# Patient Record
Sex: Female | Born: 1975 | Race: White | Hispanic: No | Marital: Married | Smoking: Never smoker
Health system: Southern US, Community
[De-identification: ages and names within clinical notes are randomized; demographics above are authoritative.]

---

## 1998-03-07 ENCOUNTER — Emergency Department (HOSPITAL_COMMUNITY): Admission: EM | Admit: 1998-03-07 | Discharge: 1998-03-07 | Payer: Self-pay | Admitting: Emergency Medicine

## 1998-04-08 ENCOUNTER — Emergency Department (HOSPITAL_COMMUNITY): Admission: EM | Admit: 1998-04-08 | Discharge: 1998-04-08 | Payer: Self-pay | Admitting: Emergency Medicine

## 1998-11-26 ENCOUNTER — Encounter: Payer: Self-pay | Admitting: Emergency Medicine

## 1998-11-26 ENCOUNTER — Emergency Department (HOSPITAL_COMMUNITY): Admission: EM | Admit: 1998-11-26 | Discharge: 1998-11-26 | Payer: Self-pay | Admitting: Emergency Medicine

## 1999-02-26 ENCOUNTER — Emergency Department (HOSPITAL_COMMUNITY): Admission: EM | Admit: 1999-02-26 | Discharge: 1999-02-26 | Payer: Self-pay | Admitting: Emergency Medicine

## 2001-02-14 ENCOUNTER — Emergency Department (HOSPITAL_COMMUNITY): Admission: EM | Admit: 2001-02-14 | Discharge: 2001-02-14 | Payer: Self-pay | Admitting: Emergency Medicine

## 2001-04-17 ENCOUNTER — Other Ambulatory Visit: Admission: RE | Admit: 2001-04-17 | Discharge: 2001-04-17 | Payer: Self-pay | Admitting: Obstetrics and Gynecology

## 2002-06-12 ENCOUNTER — Other Ambulatory Visit: Admission: RE | Admit: 2002-06-12 | Discharge: 2002-06-12 | Payer: Self-pay | Admitting: Obstetrics and Gynecology

## 2002-08-02 ENCOUNTER — Ambulatory Visit (HOSPITAL_COMMUNITY): Admission: RE | Admit: 2002-08-02 | Discharge: 2002-08-02 | Payer: Self-pay | Admitting: Obstetrics and Gynecology

## 2002-08-02 ENCOUNTER — Encounter: Payer: Self-pay | Admitting: Obstetrics and Gynecology

## 2002-11-28 ENCOUNTER — Inpatient Hospital Stay (HOSPITAL_COMMUNITY): Admission: AD | Admit: 2002-11-28 | Discharge: 2002-11-28 | Payer: Self-pay | Admitting: Obstetrics and Gynecology

## 2002-11-30 ENCOUNTER — Inpatient Hospital Stay (HOSPITAL_COMMUNITY): Admission: AD | Admit: 2002-11-30 | Discharge: 2002-11-30 | Payer: Self-pay | Admitting: Obstetrics and Gynecology

## 2002-12-02 ENCOUNTER — Encounter: Admission: RE | Admit: 2002-12-02 | Discharge: 2002-12-02 | Payer: Self-pay | Admitting: Obstetrics and Gynecology

## 2002-12-05 ENCOUNTER — Encounter: Admission: RE | Admit: 2002-12-05 | Discharge: 2002-12-05 | Payer: Self-pay | Admitting: Obstetrics and Gynecology

## 2002-12-05 ENCOUNTER — Inpatient Hospital Stay (HOSPITAL_COMMUNITY): Admission: AD | Admit: 2002-12-05 | Discharge: 2002-12-05 | Payer: Self-pay | Admitting: Internal Medicine

## 2002-12-09 ENCOUNTER — Encounter: Admission: RE | Admit: 2002-12-09 | Discharge: 2002-12-09 | Payer: Self-pay | Admitting: Obstetrics and Gynecology

## 2002-12-10 ENCOUNTER — Inpatient Hospital Stay (HOSPITAL_COMMUNITY): Admission: AD | Admit: 2002-12-10 | Discharge: 2002-12-10 | Payer: Self-pay | Admitting: Obstetrics and Gynecology

## 2002-12-10 ENCOUNTER — Encounter: Admission: RE | Admit: 2002-12-10 | Discharge: 2002-12-10 | Payer: Self-pay | Admitting: Obstetrics and Gynecology

## 2002-12-12 ENCOUNTER — Encounter: Admission: RE | Admit: 2002-12-12 | Discharge: 2002-12-12 | Payer: Self-pay | Admitting: *Deleted

## 2002-12-13 ENCOUNTER — Inpatient Hospital Stay (HOSPITAL_COMMUNITY): Admission: AD | Admit: 2002-12-13 | Discharge: 2002-12-18 | Payer: Self-pay | Admitting: Obstetrics and Gynecology

## 2002-12-14 ENCOUNTER — Encounter (INDEPENDENT_AMBULATORY_CARE_PROVIDER_SITE_OTHER): Payer: Self-pay | Admitting: Specialist

## 2002-12-17 ENCOUNTER — Encounter: Payer: Self-pay | Admitting: Cardiology

## 2004-01-23 ENCOUNTER — Emergency Department (HOSPITAL_COMMUNITY): Admission: EM | Admit: 2004-01-23 | Discharge: 2004-01-23 | Payer: Self-pay | Admitting: Family Medicine

## 2004-01-27 ENCOUNTER — Emergency Department (HOSPITAL_COMMUNITY): Admission: EM | Admit: 2004-01-27 | Discharge: 2004-01-27 | Payer: Self-pay | Admitting: Family Medicine

## 2004-12-19 ENCOUNTER — Emergency Department (HOSPITAL_COMMUNITY): Admission: EM | Admit: 2004-12-19 | Discharge: 2004-12-19 | Payer: Self-pay | Admitting: Emergency Medicine

## 2007-02-23 ENCOUNTER — Emergency Department (HOSPITAL_COMMUNITY): Admission: EM | Admit: 2007-02-23 | Discharge: 2007-02-23 | Payer: Self-pay | Admitting: Family Medicine

## 2007-04-13 ENCOUNTER — Emergency Department (HOSPITAL_COMMUNITY): Admission: EM | Admit: 2007-04-13 | Discharge: 2007-04-13 | Payer: Self-pay | Admitting: Emergency Medicine

## 2008-03-11 ENCOUNTER — Emergency Department (HOSPITAL_COMMUNITY): Admission: EM | Admit: 2008-03-11 | Discharge: 2008-03-11 | Payer: Self-pay | Admitting: Emergency Medicine

## 2009-01-02 ENCOUNTER — Emergency Department (HOSPITAL_COMMUNITY): Admission: EM | Admit: 2009-01-02 | Discharge: 2009-01-02 | Payer: Self-pay | Admitting: Family Medicine

## 2009-05-05 ENCOUNTER — Emergency Department (HOSPITAL_COMMUNITY): Admission: EM | Admit: 2009-05-05 | Discharge: 2009-05-05 | Payer: Self-pay | Admitting: Family Medicine

## 2010-05-09 LAB — POCT RAPID STREP A (OFFICE): Streptococcus, Group A Screen (Direct): POSITIVE — AB

## 2010-07-02 NOTE — Op Note (Signed)
NAME:  Carol Ruiz, Carol Ruiz                         ACCOUNT NO.:  [id number]   MEDICAL RECORD NO.:  [id number]                   PATIENT TYPE:  INP   LOCATION:  9161                                 FACILITY:  [facility]   PHYSICIAN:  Malachi Pro. Ambrose Mantle, M.D.              DATE OF BIRTH:  1975-10-19   DATE OF PROCEDURE:  12/15/2002  DATE OF DISCHARGE:                                 OPERATIVE REPORT   PREOPERATIVE DIAGNOSES:  1. Intrauterine pregnancy at 37+ weeks.  2. Preeclampsia.  3. Failure to progress in labor.  4. Probable chorioamnionitis.   POSTOPERATIVE DIAGNOSES:  1. Intrauterine pregnancy at 37+ weeks.  2. Preeclampsia.  3. Failure to progress in labor.  4. Probable chorioamnionitis.   OPERATIONS:  1. Low transverse cervical cesarean section.  2. Vacuum-assisted delivery of the vertex.   OPERATOR:  Malachi Pro. Ambrose Mantle, M.D.   ANESTHESIA:  Spinal.   DESCRIPTION OF PROCEDURE:  The patient was brought to the operating room and  given a spinal anesthetic by Dr. Pamalee Leyden.  She was placed in the left lateral  tilt position.  The abdomen and urethra were prepped with Betadine solution.  A Foley catheter was inserted to straight drain.  The area was draped as a  sterile field.  After anesthesia was confirmed, a transfer incision was made  and carried in layers through the skin, subcutaneous tissue, and fascia.  The fascia was separated from the rectum muscle superiorly and inferiorly.  The rectus muscle was split in the midline and the peritoneum was opened  vertically.  The lower uterine segment was exposed and incision was made  into the lower uterine segment with the knife.  Then I used my finger to  enter the uterine cavity.  I bluntly opened the uterus by pulling inferiorly  and superiorly.  The cord and the vertex were presenting at the incisional  opening.  I tried to deliver the vertex with fundal pressure by the  assistant, but I could not get the vertex through the incisional  opening.  I  therefore used a kiwi cup to place it on the vertex, created the pressure  into the green zone, and then with fundal pressure I gently pulled the  vertex through the incisional opening.  The nose and pharynx were suctioned  with the bulb because there was very thick bloody mucus.  Then the remainder  of the baby was delivered.  It was also a challenge.  I had to lift both  shoulders out one after the other.  The cord was clamped.  The infant was  given to Dr. Tillman Abide who was in attendance.  It was a female infant, 6 pounds 15  ounces, and Dr. Tillman Abide assigned Apgars of 1 at one minute and 8 at five  minutes.  The cord pH was 7.20.  Routine cord blood studies were obtained  and then the placenta was removed from  the uterus.  The inside of the uterus  was inspected and found to be free of debris.  Both tubes and ovaries  appeared normal.  The uterine incision was sutured with some difficulty.  Because of the patient's size, bowel and omentum tried to enter the  operative field.  I used a running lock suture of 0 Vicryl on the first  layer and a nonlocking suture of the same material on the second layer.  Liberal irrigation confirmed hemostasis.  It confirmed that the gutters were  free of blood.  The rectus muscle was then reapproximated in the midline  using interrupted sutures of 0 Vicryl.  I did not reapproximate either the  parietal or the visceral peritoneum.  The rectus fascia was then closed with  two running sutures of 0 Vicryl, the subcutaneous with a running 3-0 Vicryl,  and the skin was closed with automatic stapler.  The patient tolerated the  procedure well.  The blood loss was estimated by the nurse anesthetist at  600 mL.  I estimated 1000 mL.  The sponge and needle counts were correct.  The patient was returned to recovery in satisfactory condition.  She was  continued on her magnesium sulfate and will be continued on clindamycin and  gentamicin.  It should be noted that  in spite of vigorous manual massage of  the uterus and rapid infusion of Pitocin that the patient's uterus remained  quite boggy after delivery of the baby, but did not seem to bleed  significantly.  Finally, I was satisfied with the rigidity of the uterus and  I quit massaging.                                               Malachi Pro. Ambrose Mantle, M.D.    TFH/MEDQ  D:  12/15/2002  T:  12/15/2002  Job:  [id number]

## 2010-07-02 NOTE — Consult Note (Signed)
NAMEJARAH, PEMBER                           ACCOUNT NO.:  [id number]   MEDICAL RECORD NO.:  [id number]                   PATIENT TYPE:   LOCATION:                                       FACILITY:   PHYSICIAN:  Veneda Melter, M.D.                   DATE OF BIRTH:  December 24, 1975   DATE OF CONSULTATION:  12/17/2002  DATE OF DISCHARGE:                                   CONSULTATION   BRIEF HISTORY:  We were asked to see this 35 year old female admitted to  [facility] on December 10, 2002 to undergo induction of labor secondary  to preeclampsia.  The patient does not have a primary care physician.  She  was admitted by Malachi Pro. Ambrose Mantle, M.D.  She was noted to have an elevated  white blood cell count on admission.  She received IV Unasyn during her stay  and developed dyspnea and a rash treated with Benadryl and epinephrine.  She  has been noted to have some low grade fevers with a temperature of 102 on  the 30th.  She underwent a cesarean section on the 31st.  She was noted to  be tachycardic with a rate in the 120s-130s during the cesarean section.   Yesterday the patient also had tachycardia, rate 110-120s.  She had  occasional brady junctional rhythm as well.  She denies any chest pain.  She  has had mild shortness of breath, although she does have a history of  asthma; however, she has not really had any problems with her asthma over  the past 10 years.   PAST MEDICAL HISTORY:  1. As noted, patient has a history of asthma, but no problems for at least     10 years.  2. History of migraine headaches.  3. Preeclampsia with this pregnancy.   ALLERGIES:  UNASYN.   CURRENT MEDICATIONS:  1. Senna.  2. Mylanta.   SOCIAL HISTORY:  The patient is married.  She lives in [place].  She does  not use alcohol or tobacco.   FAMILY HISTORY:  The patient's father died at age 25 from an MI.  He also  had hypertension and diabetes mellitus.  The patient's mother is living.  She has  thyroid problems and history of migraines.   REVIEW OF SYSTEMS:  Essentially negative except for as noted above and the  following.  She has recently had fever and chills.  She does have history of  migraine headaches.  She had a rash secondary to her reaction to the  antibiotic Unasyn.  She has had some shortness of breath.  She has had some  lower extremity edema.  She has had some gas and some constipation.   PHYSICAL EXAMINATION:  GENERAL:  Pleasant 35 year old white female in no  acute distress.  VITAL SIGNS:  Blood pressure 158/100, pulse 100s up to 120s, temperature  97.3.  HEENT:  Unremarkable.  NECK:  No bruits.  No jugular venous distention.  HEART:  Regular rapid rhythm without murmur.  LUNGS:  Clear.  ABDOMEN:  Obese, soft, nontender.  SKIN:  Warm and dry.  EXTREMITIES:  2+ pitting edema bilaterally.   LABORATORY DATA:  There has not been a chest x-ray.  An EKG on the first  shows normal sinus rhythm, rate 100 beats per minute with poor R-wave  progression and a slight intraventricular conduction delay.  Another EKG  showed sinus tachycardia, rate 126, also with poor R-wave progression and a  slight intraventricular conduction delay.  A CBC reveals hemoglobin 10,  hematocrit 29.6, WBC 18.9, platelets 336,000.  TSH was 3.958.  Magnesium  level elevated at 5.9.  Uric acid elevated at 8.1.  LDH 194.  Chemistry  profile revealed BUN 8, creatinine 1.0, potassium 4.5, total bilirubin 0.2.   IMPRESSION:  1. Sinus tachycardia.  2. Junctional bradycardia.  3. Anemia.  4. Elevated wbc's and fever, question infection.  5. Postoperative day #2 c-section.  6. Reaction to Unasyn, treated with epinephrine and Benadryl.  7. History of asthma with no recent problems.  8. History of migraine headaches.  9. Recent preeclampsia.  10.      Hypertension.  11.      Elevated magnesium levels.   PLAN:  We will obtain a 2-D echocardiogram to rule out any structural  abnormalities, check  for wall motion abnormalities and ejection fraction.  We will check a D-dimer as well as a CT scan with contrast to rule out a  pulmonary embolus.  We will start a selective beta blocker to help with rate  control and hypertension.  If the above studies are negative there is a  possibility patient could be discharged tomorrow from a cardiac standpoint.     Delton See, P.A. [facility]                  Veneda Melter, M.D.    DR/MEDQ  D:  12/17/2002  T:  12/17/2002  Job:  [id number]

## 2010-07-02 NOTE — Discharge Summary (Signed)
NAME:  Carol Ruiz, Carol Ruiz                         ACCOUNT NO.:  [id number]   MEDICAL RECORD NO.:  [id number]                   PATIENT TYPE:  INP   LOCATION:  9374                                 [facility]ACILITY:  [facility]   PHYSICIAN:  Malachi Pro. Ambrose Mantle, M.D.              DATE OF BIRTH:  [birth year]   DATE OF ADMISSION:  12/13/2002  DATE OF DISCHARGE:  12/18/2002                                 DISCHARGE SUMMARY   IDENTIFYING DATA:  This dictation is being done now because at the time of  discharge the dictating equipment was not working.   HOSPITAL COURSE:  This is a 35 year old white married female para 0-0-  questionably 1-0, gravida 1 or 2 admitted for induction because of  preeclampsia, [facility] January 03, 2003.  Blood group and type O positive with a  negative antibody, nonreactive serology, rubella immune, hepatitis B surface  antigen negative, HIV declined, GC and Chlamydia negative, triple screen  normal, one-hour Glucola 117, group B strep negative.  Vaginal ultrasound on  May 31, 2002 crown-rump length 2.26 cm, 9 weeks 0 days, [facility] January 03, 2003.  The patient's prenatal course is outlined in the present illness in  the History and Physical.  Ultrasound for size greater than dates on  November 13, 2002 showed an estimated fetal weight of 2600 grams which was  approximately 96 percentile for 33 weeks.  On November 17, 2002, her blood  pressure was elevated.  She demonstrated preeclampsia by proteinuria and  persistent elevated blood pressures, nonstress test that showed borderline  fetal tachycardia but no decelerations.  The patient was admitted and placed  on magnesium sulfate.  She was also begun on Pitocin.  By 12:50 p.m. on  December 13, 2002 the contractions were every 3 minutes on 12 milliunits per  minute of Pitocin.  The cervix was a fingertip, 30% vertex, and a minus 3.  Amniotomy was attempted but was probably unsuccessful.  It was later  successful at 6:15 p.m. with Pitocin at  26 milliunits a minute, contractions  every 2 to 3 minutes, and the cervix a fingertip, 30%.  Pitocin was stopped  at 10:17 p.m. on December 13, 2002.  It was restarted on at 6 a.m. on December 14, 2002.  By 4:15 p.m., the Pitocin was at 34 milliunits a minute,  contractions every 2 to 3 minutes, cervix 2+ cm.  At 7:15 p.m. I evaluated  the patient because of shortness of breath.  The Unasyn had been given at  4:45 p.m. and shortly after that she had itching of her left arm followed by  itching on her trunk, a rash on her inner arms, and then shortness of  breath.  She was given Benadryl and then epinephrine.  The patient felt  better when I examined her.  Her lungs were clear.  I felt she has an  ALLERGY to UNASYN.  By 10:50 p.m.  the Pitocin was at 37 milliunits a minute,  cervix was unchanged, temperature had risen to 102+.  I had begun  clindamycin and gentamicin and decided to proceed with C-section for failure  to progress in labor, preeclampsia, and probable chorioamnionitis.  The  patient underwent a low transverse cervical C-section with vacuum-assistance  for the delivery of the vertex by Malachi Pro. Ambrose Mantle, M.D. under spinal  anesthesia, blood loss about 1000 mL with delivery of a female infant 6  pounds 15 ounces, Apgars of 1 at one minute and 8 at five minutes.  A cord  pH was 7.20.  Postpartum, the patient did fairly well although her blood  pressures were in the 120 to 142 over 50 to 75 range and her output exceeded  her input.  Her hemoglobin was stable.  Uric acid was 8.1.  The pulse had  been 70 to 100 but on the second postoperative day her pulse was 110 to 120.  By that afternoon her pulse was in the 120 to 130 range and she was  occasionally short of breath and occasionally felt that her heart was in her  chest.  Her tachycardia persisted.  Repeat blood counts indicated that it  was not a hemodynamic problem so we asked cardiology to see her.  They felt  that she was ready  for discharge on the fourth postoperative day.  They did  an echocardiogram which showed no evidence of a cardiomyopathy.  They placed  her on Lopressor and at the time of discharge the patient was on Lopressor  to go home with it.  Her initial hemoglobin was 12.3, hematocrit 36.3, white  count 16,200, platelet count 302,000.  Follow up blood counts were all in  the range of the hematocrit of 30.  Comprehensive metabolic profile on more  than one occasion was essentially normal.  Her magnesium level while she was  on magnesium was in therapeutic range.  Her TSH to evaluate the tachycardia  was 3.958.  RPR was nonreactive.  A chest CT with contrast showed a normal  examination.  The echocardiogram done to assess left ventricular function  and evaluate for regional wall motion abnormalities showed overall left  ventricular systolic function was at the lower limit of normal.  The left  ventricular ejection fraction was estimated to be 50-55%.  There were no  left ventricular regional wall motion abnormalities.  The left ventricular  wall thickness was mildly increased.  Left atrial size was at the upper  limit of normal.   FINAL DIAGNOSES:  Final diagnoses after the patient's staples were removed  and strips were applied were intrauterine pregnancy at 37+ weeks,  preeclampsia, probable chorioamnionitis, tachycardia of uncertain origin,  normal left ventricular function.  The patient was seen in consultation by  Veneda Melter, M.D.  ALLERGY to UNASYN.   FINAL CONDITION:  Improved.   INSTRUCTIONS:  Included our regular discharge instruction booklet.  She was  given a prescription for Lopressor 50 mg by mouth every 12 hours, to see Dr.  Chales Abrahams in 10 days, to take Percocet 5/325 mg one tablet every 6 hours as  needed for pain, and to return to see me in 10 to 14 days for followup  examination.  Malachi Pro. Ambrose Mantle, M.D.   TFH/MEDQ  D:  01/13/2003   T:  01/13/2003  Job:  [id number]   cc:   Veneda Melter, M.D.

## 2010-07-02 NOTE — H&P (Signed)
Carol Ruiz, Carol Ruiz                         ACCOUNT NO.:  [id number]   MEDICAL RECORD NO.:  [id number]                   PATIENT TYPE:  INP   LOCATION:  9166                                 FACILITY:  [facility]   PHYSICIAN:  Malachi Pro. Ambrose Mantle, M.D.              DATE OF BIRTH:  August 31, 1975   DATE OF ADMISSION:  12/13/2002  DATE OF DISCHARGE:                                HISTORY & PHYSICAL   This is a 35 year old white, married female, para 0-0-possible 1-0, gravida  1-2, admitted for induction of labor because of pre-eclampsia.  [facility] -  January 03, 2003.  Blood group and type O positive, negative antibody,  nonreactive serology, rubella immune, hepatitis B surface antigen negative,  HIV declined, GC and Chlamydia negative, triple screen normal, one hour  Glucola 117, Group B strep negative.  Vaginal ultrasound, on May 31, 2002,  crown rump length 2.26-cm, 9 weeks 0 days, [facility] - January 03, 2003.  The  patient had genetics counseling because of a family history of Leigh's  disease.  Genetics counseling suggested unlikely for her to have a baby with  Leigh's disease.  One hour Glucola at 16 weeks was 125.  Ultrasound, on August 02, 2002, average gestational age [redacted] weeks 0 days, [facility] - December 27, 2002.  Ultrasound for size greater than dates, on November 13, 2002, showed an  estimated fetal weight of 2600 grams which was approximately the 96th  percentile for 33 weeks.  On November 17, 2002, the patient's blood pressure  was elevated.  She has demonstrated pre-eclampsia by proteinuria and  persistent elevated blood pressure, nonstress tests have shown a borderline  fetal tachycardia but no decelerations.   PAST MEDICAL HISTORY:  No known allergies.   OPERATIONS:  Perianal abscess x 2 in 1999.   ILLNESSES:  1. Asthma.  2. Migraines.   FAMILY HISTORY:  Father died at 58 of high blood pressure, MI and diabetes.  Mother with thyroid problems and migraines.   Alcohol, tobacco and  drugs:  None.   PHYSICAL EXAMINATION:  VITAL SIGNS:  Temperature, on admission, was 97,  blood pressure 177/106, pulse 81, respirations 20.  HEENT:  Negative.  HEART:  Normal size and sounds.  No murmurs.  LUNGS:  Clear to P&A.  ABDOMEN:  Soft.  Fundal height 40-cm on December 12, 2002.  Fetal heart tones  were normal.  Baseline fetal tachycardia 155-165.  PELVIC:  The cervix was a tight fingertip, 30%, vertex at a -3 on December 12, 2002.   The patient was begun on Pitocin.  By 12:50 p.m. on the day of admission the  Pitocin was at 12 milliunits a minute.  Contractions were every three  minutes but they stopped when I laid her flat for an attempt at an  amniotomy.  Amniotomy was attempted but it was unsuccessful.  By 6:15 p.m.,  the contractions were every 2-3 minutes,  Pitocin at 26 milliunits a minute.  Cervix was at fingertip, 30%, vertex and a -3.  Artifical rupture of the  membranes did produce clear fluid.  The Pitocin was then stopped at 10:17  p.m. on December 13, 2002.  It was re-started at 6 a.m. on December 14, 2002.  She began to contract regularly.  Pitocin was raised 39 milliunits a minute.  Contractions were every 2-3 minutes.  By 4:15 p.m., the cervix was thought  to be 2+ cm, 50%, vertex, and a -2 to -3.  However, subsequently she did not  make any change.  She was treated for an allergic reaction to [facility] with  Benadryl and epinephrine.  The allergic reaction included a rash on her  inner arms and a severe cough, wheezing and shortness of breath.  At 10:45  p.m., the patient's cervix had still not changed beyond 2+ cm.  Her  temperature had risen to over 102 degrees, and it was felt that she needed  to proceed with a cesarean section.   IMPRESSION:  1. Intrauterine pregnancy 37+ weeks.  2. Pre-eclampsia.  3. Failure to progress in labor.  4. Probable chorioamnionitis.   The patient is prepared for C-section.                                               Malachi Pro. Ambrose Mantle, M.D.    TFH/MEDQ  D:  12/14/2002  T:  12/14/2002  Job:  [id number]

## 2010-08-05 ENCOUNTER — Other Ambulatory Visit: Payer: Self-pay | Admitting: Obstetrics and Gynecology

## 2012-12-27 ENCOUNTER — Emergency Department (HOSPITAL_COMMUNITY)
Admission: EM | Admit: 2012-12-27 | Discharge: 2012-12-27 | Disposition: A | Discharge status: Home / Self Care | Payer: PRIVATE HEALTH INSURANCE | Source: Home / Self Care | Attending: Emergency Medicine | Admitting: Emergency Medicine

## 2012-12-27 ENCOUNTER — Encounter (HOSPITAL_COMMUNITY): Payer: Self-pay | Admitting: Emergency Medicine

## 2012-12-27 DIAGNOSIS — G43909 Migraine, unspecified, not intractable, without status migrainosus: Secondary | ICD-10-CM

## 2012-12-27 LAB — POCT PREGNANCY, URINE: Preg Test, Ur: NEGATIVE

## 2012-12-27 MED ORDER — PREDNISONE 20 MG PO TABS: 20.0000 mg | ORAL_TABLET | Freq: Two times a day (BID) | ORAL | Status: DC

## 2012-12-27 MED ORDER — KETOROLAC TROMETHAMINE 30 MG/ML IJ SOLN
INTRAMUSCULAR | Status: AC
  Filled 2012-12-27: qty 1

## 2012-12-27 MED ORDER — DEXAMETHASONE SODIUM PHOSPHATE 10 MG/ML IJ SOLN
10.0000 mg | Freq: Once | INTRAMUSCULAR | Status: AC
  Administered 2012-12-27: 10 mg via INTRAMUSCULAR

## 2012-12-27 MED ORDER — KETOROLAC TROMETHAMINE 60 MG/2ML IM SOLN
30.0000 mg | Freq: Once | INTRAMUSCULAR | Status: AC
  Administered 2012-12-27: 30 mg via INTRAMUSCULAR

## 2012-12-27 MED ORDER — METOCLOPRAMIDE HCL 5 MG/ML IJ SOLN
INTRAMUSCULAR | Status: AC
  Filled 2012-12-27: qty 2

## 2012-12-27 MED ORDER — ONDANSETRON 8 MG PO TBDP: 8.0000 mg | ORAL_TABLET | Freq: Three times a day (TID) | ORAL | Status: DC | PRN

## 2012-12-27 MED ORDER — METOCLOPRAMIDE HCL 5 MG/ML IJ SOLN
10.0000 mg | Freq: Once | INTRAMUSCULAR | Status: AC
  Administered 2012-12-27: 10 mg via INTRAMUSCULAR

## 2012-12-27 MED ORDER — DEXAMETHASONE SODIUM PHOSPHATE 10 MG/ML IJ SOLN
INTRAMUSCULAR | Status: AC
  Filled 2012-12-27: qty 1

## 2012-12-27 NOTE — ED Notes (Signed)
C/o migraine since this morning around 3 am States she woke up out here sleep with the migraine and took Imitrex and went back to sleep for an hour.   States she is sensitive to light and sound Headache did make the patient feel nausea and vomit.

## 2012-12-27 NOTE — ED Provider Notes (Signed)
Chief Complaint:   Chief Complaint  Patient presents with  . Migraine    History of Present Illness:   Carol Ruiz is a 37 year old female who has had migraines for years. She gets infrequent attacks, usually only one or 2 per year. Her last attack was about 9 months ago. She has Imitrex on hand. Current attack began at 3 AM this morning. She tried Imitrex, and vomited about an hour later, so it is not certain whether she really kept the Imitrex down or not. She describes right sided, unilateral, throbbing headache with nausea, vomiting, photophobia, and phonophobia. She denies any visual or neurological symptoms. She's had no fever or stiff neck. There's no specific precipitating factor, although the patient states that she has been under some stress at school and with finals. She has been getting sufficient rest.  Review of Systems:  Other than noted above, the patient denies any of the following symptoms: Systemic:  No fever, chills, fatigue, photophobia, stiff neck. Eye:  No redness, eye pain, discharge, blurred vision, or diplopia. ENT:  No nasal congestion, rhinorrhea, sinus pressure or pain, sneezing, earache, or sore throat.  No jaw claudication. Neuro:  No paresthesias, loss of consciousness, seizure activity, muscle weakness, trouble with coordination or gait, trouble speaking or swallowing. Psych:  No depression, anxiety or trouble sleeping.  PMFSH:  Past medical history, family history, social history, meds, and allergies were reviewed.  Her only allergy is to Unasyn. She is overactive bladder depression and takes medication for that. She has type 2 diabetes which is diet controlled. Her last menstrual period was October 17.  Physical Exam:   Vital signs:  BP 140/81  Pulse 65  Temp(Src) 97.6 F (36.4 C) (Oral)  Resp 16  SpO2 99% General:  Alert and oriented.  Appears uncomfortable, she is sitting in a darkened room quietly. Eye:  Lids and conjunctivas normal.  PERRL,  Full  EOMs.  Fundi benign with normal discs and vessels. ENT:  No cranial or facial tenderness to palpation.  TMs and canals clear.  Nasal mucosa was normal and uncongested without any drainage. No intra oral lesions, pharynx clear, mucous membranes moist, dentition normal. Neck:  Supple, full ROM, no tenderness to palpation.  No adenopathy or mass. Neuro:  Alert and orented times 3.  Speech was clear, fluent, and appropriate.  Cranial nerves intact. No pronator drift, muscle strength normal. Finger to nose normal.  DTRs were 2+ and symmetrical.Station and gait were normal.  Romberg's sign was normal.  Able to perform tandem gait well. Psych:  Normal affect.  Course in Urgent Care Center:   Given Toradol 30 mg IM, Decadron 10 mg IM, and Reglan 10 mg IM.  Assessment:  The encounter diagnosis was Migraine headache.  Plan:   1.  Meds:  The following meds were prescribed:   New Prescriptions   ONDANSETRON (ZOFRAN ODT) 8 MG DISINTEGRATING TABLET    Take 1 tablet (8 mg total) by mouth every 8 (eight) hours as needed for nausea.   PREDNISONE (DELTASONE) 20 MG TABLET    Take 1 tablet (20 mg total) by mouth 2 (two) times daily.    2.  Patient Education/Counseling:  The patient was given appropriate handouts, self care instructions, and instructed in symptomatic relief.  She can get another Imitrex at home today if she needs it. Suggested taking the Zofran first.  3.  Follow up:  The patient was told to follow up if no better in 3 to 4 days,  if becoming worse in any way, and given some red flag symptoms such as persistent headache, fever, or neurological symptoms which would prompt immediate return.  Follow up with her primary care physician, Dr. Merri Brunette.     Reuben Likes, MD 12/27/12 [phone]

## 2013-03-05 ENCOUNTER — Emergency Department (HOSPITAL_COMMUNITY)
Admission: EM | Admit: 2013-03-05 | Discharge: 2013-03-05 | Disposition: A | Discharge status: Home / Self Care | Payer: PRIVATE HEALTH INSURANCE | Source: Home / Self Care

## 2013-03-05 ENCOUNTER — Encounter (HOSPITAL_COMMUNITY): Payer: Self-pay | Admitting: Emergency Medicine

## 2013-03-05 DIAGNOSIS — J9801 Acute bronchospasm: Secondary | ICD-10-CM

## 2013-03-05 DIAGNOSIS — J069 Acute upper respiratory infection, unspecified: Secondary | ICD-10-CM

## 2013-03-05 NOTE — ED Provider Notes (Signed)
CSN: [id number]     Arrival date & time 03/05/13  1222 History   First MD Initiated Contact with Patient 03/05/13 1342     Chief Complaint  Patient presents with  . URI   (Consider location/radiation/quality/duration/timing/severity/associated sxs/prior Treatment) HPI Comments: 38 year old female he 3 day history of sore throat, sneezing, sniffles, scratchy throat and cough that is often associated with posttussive emesis. One day she had a temperature as high as 99.7. She has a history of asthma but has not proceed that this has been a problem.   History reviewed. No pertinent past medical history. History reviewed. No pertinent past surgical history. History reviewed. No pertinent family history. History  Substance Use Topics  . Smoking status: Never Smoker   . Smokeless tobacco: Not on file  . Alcohol Use: No   OB History   Grav Para Term Preterm Abortions TAB SAB Ect Mult Living                 Review of Systems  Constitutional: Positive for fever, activity change and fatigue. Negative for chills and appetite change.  HENT: Positive for congestion, postnasal drip and rhinorrhea. Negative for facial swelling.   Eyes: Negative.   Respiratory: Positive for cough. Negative for shortness of breath.   Cardiovascular: Negative.   Gastrointestinal: Positive for vomiting.  Musculoskeletal: Negative for neck pain and neck stiffness.  Skin: Negative for pallor and rash.  Neurological: Negative.     Allergies  Unasyn  Home Medications   Current Outpatient Rx  Name  Route  Sig  Dispense  Refill  . ondansetron (ZOFRAN ODT) 8 MG disintegrating tablet   Oral   Take 1 tablet (8 mg total) by mouth every 8 (eight) hours as needed for nausea.   20 tablet   0   . predniSONE (DELTASONE) 20 MG tablet   Oral   Take 1 tablet (20 mg total) by mouth 2 (two) times daily.   10 tablet   0    BP 144/91  Pulse 117  Temp(Src) 98.7 F (37.1 C) (Oral)  Resp 16  SpO2 99%  LMP  02/05/2013 Physical Exam  Nursing note and vitals reviewed. Constitutional: She is oriented to person, place, and time. She appears well-developed and well-nourished. No distress.  HENT:  Mouth/Throat: Oropharyngeal exudate present.  Bilateral TMs are normal Oropharynx with clear PND and minor streaky erythema.  Eyes: Conjunctivae and EOM are normal.  Neck: Normal range of motion. Neck supple.  Cardiovascular: Normal rate, regular rhythm and normal heart sounds.   Pulmonary/Chest: Effort normal and breath sounds normal. No respiratory distress. She has no wheezes. She has no rales.  No wheezing heard with deep breaths. With cough there is distant coarseness.  Musculoskeletal: Normal range of motion. She exhibits no edema.  Lymphadenopathy:    She has no cervical adenopathy.  Neurological: She is alert and oriented to person, place, and time.  Skin: Skin is warm and dry. No rash noted.  Psychiatric: She has a normal mood and affect.    ED Course  Procedures (including critical care time) Labs Review Labs Reviewed - No data to display Imaging Review No results found.    MDM   1. URI (upper respiratory infection)   2. Bronchospasm      Suspect she is having occult bronchospasm which is causing her to have coughing spasms. Recommend use her albuterol he did take 2 puffs every 4 hours when necessary cough and possible release. Drink plenty fluids Alka-Seltzer cold  plus nighttime medication Tylenol when necessary    Hayden Rasmussenavid Reyli Schroth, NP 03/05/13 1427

## 2013-03-05 NOTE — Discharge Instructions (Signed)
Bronchospasm, Adult  A bronchospasm is when the tubes that carry air in and out of your lungs (airwarys) spasm or tighten. During a bronchospasm it is hard to breathe. This is because the airways get smaller. A bronchospasm can be triggered by:   Allergies. These may be to animals, pollen, food, or mold.   Infection. This is a common cause of bronchospasm.   Exercise.   Irritants. These include pollution, cigarette smoke, strong odors, aerosol sprays, and paint fumes.   Weather changes.   Stress.   Being emotional.  HOME CARE    Always have a plan for getting help. Know when to call your doctor and local emergency services (911 in the [place] Know where you can get emergency care.   Only take medicines as told by your doctor.   If you were prescribed an inhaler or nebulizer machine, ask your doctor how to use it correctly. Always use a spacer with your inhaler if you were given one.   Stay calm during an attack. Try to relax and breathe more slowly.   Control your home environment:   Change your heating and air conditioning filter at least once a month.   Limit your use of fireplaces and wood stoves.   Do not  smoke. Do not  allow smoking in your home.   Avoid perfumes and fragrances.   Get rid of pests (such as roaches and mice) and their droppings.   Throw away plants if you see mold on them.   Keep your house clean and dust free.   Replace carpet with wood, tile, or vinyl flooring. Carpet can trap dander and dust.   Use allergy-proof pillows, mattress covers, and box spring covers.   Wash bed sheets and blankets every week in hot water. Dry them in a dryer.   Use blankets that are made of polyester or cotton.   Wash hands frequently.  GET HELP IF:   You have muscle aches.   You have chest pain.   The thick spit you spit or cough up (sputum) changes from clear or white to yellow, green, gray, or bloody.   The thick spit you spit or cough up gets thicker.   There are problems that may be  related to the medicine you are given such as:   A rash.   Itching.   Swelling.   Trouble breathing.  GET HELP RIGHT AWAY IF:   You feel you cannot breathe or catch your breath.   You cannot stop coughing.   Your treatment is not helping you breathe better.  MAKE SURE YOU:    Understand these instructions.   Will watch your condition.   Will get help right away if you are not doing well or get worse.  Document Released: 11/28/2008 Document Revised: 10/03/2012 Document Reviewed: 07/24/2012  [employer] Patient Information 2014 [employer]  Upper Respiratory Infection, Adult  An upper respiratory infection (URI) is also known as the common cold. It is often caused by a type of germ (virus). Colds are easily spread (contagious). You can pass it to others by kissing, coughing, sneezing, or drinking out of the same glass. Usually, you get better in 1 or 2 weeks.   HOME CARE    Only take medicine as told by your doctor.   Use a warm mist humidifier or breathe in steam from a hot shower.   Drink enough water and fluids to keep your pee (urine) clear or pale yellow.   Get plenty   of rest.   Return to work when your temperature is back to normal or as told by your doctor. You may use a face mask and wash your hands to stop your cold from spreading.  GET HELP RIGHT AWAY IF:    After the first few days, you feel you are getting worse.   You have questions about your medicine.   You have chills, shortness of breath, or brown or red spit (mucus).   You have yellow or brown snot (nasal discharge) or pain in the face, especially when you bend forward.   You have a fever, puffy (swollen) neck, pain when you swallow, or white spots in the back of your throat.   You have a bad headache, ear pain, sinus pain, or chest pain.   You have a high-pitched whistling sound when you breathe in and out (wheezing).   You have a lasting cough or cough up blood.   You have sore muscles or a stiff neck.  MAKE SURE YOU:     Understand these instructions.   Will watch your condition.   Will get help right away if you are not doing well or get worse.  Document Released: 07/20/2007 Document Revised: 04/25/2011 Document Reviewed: 06/07/2010  [employer] Patient Information 2014 [employer]

## 2013-03-05 NOTE — ED Notes (Signed)
C/o  Persistent nonproductive cough.  Runny nose.  Low grade temp.  Fatigue.  N/v.   Denies diarrhea.  Symptoms present since Saturday.  No relief with otc meds.

## 2013-03-05 NOTE — ED Provider Notes (Signed)
Medical screening examination/treatment/procedure(s) were performed by resident physician or non-physician practitioner and as supervising physician I was immediately available for consultation/collaboration.   Barkley BrunsKINDL,Zorina Mallin DOUGLAS MD.   Linna HoffJames D Brannon Decaire, MD 03/05/13 [phone]

## 2014-01-14 ENCOUNTER — Telehealth: Payer: Self-pay | Admitting: Hematology

## 2014-01-14 NOTE — Telephone Encounter (Signed)
S/W PATIENT AND GAVE NP APPT FOR 12/17 @ 10:30 W/DR. FENG REFERRING DR. Sonny MastersANDACE SMITH DX- LEUKOCYTOSIS WELCOME PACKET MAILED Toni AmendW/CALENDAR

## 2014-01-14 NOTE — Telephone Encounter (Signed)
LEFT MESSAGE FOR PATIENT TO RETURN CALL TO SCHEDULE NP APPT.  °

## 2014-01-30 ENCOUNTER — Ambulatory Visit (HOSPITAL_BASED_OUTPATIENT_CLINIC_OR_DEPARTMENT_OTHER): Payer: PRIVATE HEALTH INSURANCE

## 2014-01-30 ENCOUNTER — Encounter (INDEPENDENT_AMBULATORY_CARE_PROVIDER_SITE_OTHER): Payer: Self-pay

## 2014-01-30 ENCOUNTER — Telehealth: Payer: Self-pay | Admitting: Hematology

## 2014-01-30 ENCOUNTER — Encounter: Payer: Self-pay | Admitting: Hematology

## 2014-01-30 ENCOUNTER — Other Ambulatory Visit: Payer: PRIVATE HEALTH INSURANCE

## 2014-01-30 ENCOUNTER — Ambulatory Visit (HOSPITAL_BASED_OUTPATIENT_CLINIC_OR_DEPARTMENT_OTHER): Payer: PRIVATE HEALTH INSURANCE | Admitting: Hematology

## 2014-01-30 VITALS — BP 132/96 | HR 118 | Temp 98.3°F | Resp 20 | Ht 63.0 in | Wt 283.9 lb

## 2014-01-30 DIAGNOSIS — D72829 Elevated white blood cell count, unspecified: Secondary | ICD-10-CM

## 2014-01-30 DIAGNOSIS — E119 Type 2 diabetes mellitus without complications: Secondary | ICD-10-CM

## 2014-01-30 DIAGNOSIS — G43909 Migraine, unspecified, not intractable, without status migrainosus: Secondary | ICD-10-CM

## 2014-01-30 DIAGNOSIS — F329 Major depressive disorder, single episode, unspecified: Secondary | ICD-10-CM

## 2014-01-30 DIAGNOSIS — J302 Other seasonal allergic rhinitis: Secondary | ICD-10-CM

## 2014-01-30 DIAGNOSIS — F32A Depression, unspecified: Secondary | ICD-10-CM

## 2014-01-30 DIAGNOSIS — J309 Allergic rhinitis, unspecified: Secondary | ICD-10-CM

## 2014-01-30 DIAGNOSIS — J4520 Mild intermittent asthma, uncomplicated: Secondary | ICD-10-CM

## 2014-01-30 DIAGNOSIS — J069 Acute upper respiratory infection, unspecified: Secondary | ICD-10-CM

## 2014-01-30 LAB — CBC & DIFF AND RETIC
BASO%: 0.2 % (ref 0.0–2.0)
BASOS ABS: 0 10*3/uL (ref 0.0–0.1)
EOS ABS: 0.5 10*3/uL (ref 0.0–0.5)
EOS%: 4.3 % (ref 0.0–7.0)
HCT: 42.6 % (ref 34.8–46.6)
HEMOGLOBIN: 13.9 g/dL (ref 11.6–15.9)
IMMATURE RETIC FRACT: 7.5 % (ref 1.60–10.00)
LYMPH#: 2.8 10*3/uL (ref 0.9–3.3)
LYMPH%: 22.8 % (ref 14.0–49.7)
MCH: 28.7 pg (ref 25.1–34.0)
MCHC: 32.6 g/dL (ref 31.5–36.0)
MCV: 87.8 fL (ref 79.5–101.0)
MONO#: 0.7 10*3/uL (ref 0.1–0.9)
MONO%: 5.5 % (ref 0.0–14.0)
NEUT%: 67.2 % (ref 38.4–76.8)
NEUTROS ABS: 8.2 10*3/uL — AB (ref 1.5–6.5)
Platelets: 342 10*3/uL (ref 145–400)
RBC: 4.85 10*6/uL (ref 3.70–5.45)
RDW: 12.8 % (ref 11.2–14.5)
RETIC %: 1.86 % (ref 0.70–2.10)
RETIC CT ABS: 90.21 10*3/uL (ref 33.70–90.70)
WBC: 12.2 10*3/uL — ABNORMAL HIGH (ref 3.9–10.3)
nRBC: 0 % (ref 0–0)

## 2014-01-30 LAB — COMPREHENSIVE METABOLIC PANEL (CC13)
ALT: 43 U/L (ref 0–55)
AST: 30 U/L (ref 5–34)
Albumin: 3.4 g/dL — ABNORMAL LOW (ref 3.5–5.0)
Alkaline Phosphatase: 91 U/L (ref 40–150)
Anion Gap: 9 mEq/L (ref 3–11)
BUN: 10.7 mg/dL (ref 7.0–26.0)
CHLORIDE: 104 meq/L (ref 98–109)
CO2: 26 meq/L (ref 22–29)
CREATININE: 0.7 mg/dL (ref 0.6–1.1)
Calcium: 9 mg/dL (ref 8.4–10.4)
EGFR: >90 mL/min/{1.73_m2} (ref >90)
Glucose: 106 mg/dl (ref 70–140)
Potassium: 4.2 mEq/L (ref 3.5–5.1)
Sodium: 139 mEq/L (ref 136–145)
Total Bilirubin: 0.25 mg/dL (ref 0.20–1.20)
Total Protein: 7.1 g/dL (ref 6.4–8.3)

## 2014-01-30 NOTE — Progress Notes (Signed)
Checked in new pt with no financial concerns at this time.  Pt has Raquel's card for any billing or insurance questions or concerns. ° °

## 2014-01-30 NOTE — Progress Notes (Signed)
Carol Ruiz  Telephone:(336) [phone] Fax:(336) [place] Note   Patient Care Team: Reginia Naas, MD as PCP - General (Family Medicine) 02/02/2014  CHIEF COMPLAINTS/PURPOSE OF CONSULTATION:  Leukocytosis   HISTORY OF PRESENTING ILLNESS:  Carol Ruiz 38 y.o. female is here because of mild leukocytosis.   She was found to have elevated WBC since 09/2013. She has moderae seasonal allergy, and she was having  Some allergy symptoms during that time. She was seen by her PCP who ordered some lab work. Her CBC showed WBC 14.1K, ANC 9.5K, ALC 3.3K, Mono 0.9K, normal H/H and plt counts. She had some URI symptoms in Sep and Nov and repeated CBC in Sep and Nov 2015 showed WBC 11.0K, with ANC 7.5K, ALC 2.4K, the rest of CBC and diff were normal.   Her menstrual period has been irregular since one year ago, sometime spotting, sometime heavy, she also has hot flush, she was followed by her GYN, not sure if she is perimenopausal. She denies any history of chronic infection, such as hepatitis B or C, no fever, chills, weight loss, night sweats, pruritis, etc.   Patient Active Problem List   Diagnosis Date Noted  . Asthma 02/02/2014  . Diabetes mellitus 02/02/2014  . Allergic rhinitis 02/02/2014  . Depression 02/02/2014  . Migraines 02/02/2014     SURGICAL HISTORY: History reviewed. No pertinent past surgical history.  SOCIAL HISTORY: History   Social History  . Marital Status: Married    Spouse Name: N/A    Number of Children: N/A  . Years of Education: N/A   Occupational History  . Not on file.   Social History Main Topics  . Smoking status: Never Smoker   . Smokeless tobacco: Not on file  . Alcohol Use: No  . Drug Use: No  . Sexual Activity: No   Other Topics Concern  . Not on file   Social History Narrative    FAMILY HISTORY: Family History  Problem Relation Age of Onset  . Cancer Mother 81    lung cancer   . Diabetes  Father   . CAD Father   . Cancer Maternal Aunt 40    breast and uterine cancer     ALLERGIES:  is allergic to penicillins and unasyn.  MEDICATIONS:  Current Outpatient Prescriptions  Medication Sig Dispense Refill  . albuterol-ipratropium (COMBIVENT) 18-103 MCG/ACT inhaler Inhale 2 puffs into the lungs every 4 (four) hours as needed for wheezing or shortness of breath.    . cetirizine (ZYRTEC) 10 MG tablet Take 10 mg by mouth daily.    Marland Kitchen oxybutynin (DITROPAN XL) 15 MG 24 hr tablet Take 15 mg by mouth daily. as directed  0  . venlafaxine XR (EFFEXOR-XR) 75 MG 24 hr capsule   0   No current facility-administered medications for this visit.    REVIEW OF SYSTEMS:   Constitutional: Denies fevers, chills or abnormal night sweats Eyes: Denies blurriness of vision, double vision or watery eyes Ears, nose, mouth, throat, and face: Denies mucositis or sore throat Respiratory: Denies cough, dyspnea or wheezes Cardiovascular: Denies palpitation, chest discomfort or lower extremity swelling Gastrointestinal:  Denies nausea, heartburn or change in bowel habits Skin: Denies abnormal skin rashes Lymphatics: Denies new lymphadenopathy or easy bruising Neurological:Denies numbness, tingling or new weaknesses Behavioral/Psych: Mood is stable, no new changes  All other systems were reviewed with the patient and are negative except those in history .  PHYSICAL EXAMINATION: ECOG PERFORMANCE STATUS:  0 - Asymptomatic  Filed Vitals:   01/30/14 1103  BP: 132/96  Pulse: 118  Temp: 98.3 F (36.8 C)  Resp: 20   Filed Weights   01/30/14 1103  Weight: 283 lb 14.4 oz (128.776 kg)    GENERAL:alert, no distress and comfortable SKIN: skin color, texture, turgor are normal, no rashes or significant lesions EYES: normal, conjunctiva are pink and non-injected, sclera clear OROPHARYNX:no exudate, no erythema and lips, buccal mucosa, and tongue normal  NECK: supple, thyroid normal size, non-tender,  without nodularity LYMPH:  no palpable lymphadenopathy in the cervical, axillary or inguinal LUNGS: clear to auscultation and percussion with normal breathing effort HEART: regular rate & rhythm and no murmurs and no lower extremity edema ABDOMEN:abdomen soft, non-tender and normal bowel sounds Musculoskeletal:no cyanosis of digits and no clubbing  PSYCH: alert & oriented x 3 with fluent speech NEURO: no focal motor/sensory deficits  LABORATORY DATA:  I have reviewed the data as listed Lab Results  Component Value Date   WBC 12.2* 01/30/2014   HGB 13.9 01/30/2014   HCT 42.6 01/30/2014   MCV 87.8 01/30/2014   PLT 342 01/30/2014    Recent Labs  01/30/14 1239  NA 139  K 4.2  CO2 26  GLUCOSE 106  BUN 10.7  CREATININE 0.7  CALCIUM 9.0  PROT 7.1  ALBUMIN 3.4*  AST 30  ALT 43  ALKPHOS 91  BILITOT 0.25    RADIOGRAPHIC STUDIES: I have personally reviewed the radiological images as listed and agreed with the findings in the report. Dg Chest 2 View  01/31/2014   CLINICAL DATA:  Acute bronchitis.  Chest pain.  EXAM: CHEST  2 VIEW  COMPARISON:  12/17/2002  FINDINGS: Normal mediastinum and cardiac silhouette. Normal pulmonary vasculature. No evidence of effusion, infiltrate, or pneumothorax. No acute bony abnormality.  IMPRESSION: Normal chest radiograph.   Electronically Signed   By: Suzy Bouchard M.D.   On: 01/31/2014 15:07    ASSESSMENT & PLAN:  38 yo female with history of allergic rhinitis, asthma, DM, depression, who was found to have mildly elevated WBC, mainly neutrophils, when she has some allergic symptoms and URI episodes.   1. Mild leukocytosis, with dominant neutrophil cells -this is likely related to her allergy and URI symptoms. Myeloproliferative neoplasm (MPN), such as CML, is less likely, give the degree of her leukocytosis and normal H/H and platelet counts. But I will obtain a BCR/ABL and JAK2 mutation tests to rule out MPN. Will review her peripheral smear.    -I will continue follow her CBC and diff every 3-6 month to see her leukocytosis trend.  -I do not think bone marrow biopsy is needed for now.   2. Asthma and allergy -I suggest her to see a allergy specialist   3. URI, acute bronchitis  -Given her sick contact (Mom) and productive cough, I gave her a course of azithromycin    Orders Placed This Encounter  Procedures  . US Abdomen Limited    Standing Status: Future     Number of Occurrences:      Standing Expiration Date: 01/30/2015    Order Specific Question:  Reason for Exam (SYMPTOM  OR DIAGNOSIS REQUIRED)    Answer:  leukocytosis, evaluate liver and spleen    Order Specific Question:  Preferred imaging location?    Answer:  [facility]  . CBC & Diff and Retic    Standing Status: Future     Number of Occurrences: 1  Standing Expiration Date: 01/31/2015  . Comprehensive metabolic panel (Cmet) - [facility]    Standing Status: Future     Number of Occurrences: 1     Standing Expiration Date: 01/31/2015  . [id number]    Standing Status: Future     Number of Occurrences: 1     Standing Expiration Date: 01/31/2015  . JAK-2 Exon 12 (only if [id number] neg)    Standing Status: Future     Number of Occurrences: 1     Standing Expiration Date: 01/31/2015  . bcr/abl    Standing Status: Future     Number of Occurrences: 1     Standing Expiration Date: 01/31/2015  . CBC with Differential    Standing Status: Future     Number of Occurrences:      Standing Expiration Date: 01/30/2015    All questions were answered. The patient knows to call the clinic with any problems, questions or concerns. I spent 25 minutes counseling the patient face to face. The total time spent in the appointment was 30 minutes and more than 50% was on counseling.     Truitt Merle, MD 02/02/2014 2:16 AM

## 2014-01-30 NOTE — Telephone Encounter (Signed)
Gave avs & cal for March. °

## 2014-01-31 ENCOUNTER — Other Ambulatory Visit: Payer: Self-pay | Admitting: Physician Assistant

## 2014-01-31 ENCOUNTER — Ambulatory Visit
Admission: RE | Admit: 2014-01-31 | Discharge: 2014-01-31 | Disposition: A | Discharge status: Home / Self Care | Payer: PRIVATE HEALTH INSURANCE | Source: Ambulatory Visit | Attending: Physician Assistant | Admitting: Physician Assistant

## 2014-01-31 DIAGNOSIS — J209 Acute bronchitis, unspecified: Secondary | ICD-10-CM

## 2014-02-02 ENCOUNTER — Encounter: Payer: Self-pay | Admitting: Hematology

## 2014-02-02 DIAGNOSIS — J45909 Unspecified asthma, uncomplicated: Secondary | ICD-10-CM | POA: Insufficient documentation

## 2014-02-02 DIAGNOSIS — J309 Allergic rhinitis, unspecified: Secondary | ICD-10-CM | POA: Insufficient documentation

## 2014-02-02 DIAGNOSIS — F329 Major depressive disorder, single episode, unspecified: Secondary | ICD-10-CM | POA: Insufficient documentation

## 2014-02-02 DIAGNOSIS — G43909 Migraine, unspecified, not intractable, without status migrainosus: Secondary | ICD-10-CM | POA: Insufficient documentation

## 2014-02-02 DIAGNOSIS — E119 Type 2 diabetes mellitus without complications: Secondary | ICD-10-CM | POA: Insufficient documentation

## 2014-02-02 DIAGNOSIS — F32A Depression, unspecified: Secondary | ICD-10-CM | POA: Insufficient documentation

## 2014-02-13 ENCOUNTER — Other Ambulatory Visit: Payer: Self-pay | Admitting: Hematology

## 2014-02-13 ENCOUNTER — Ambulatory Visit (HOSPITAL_COMMUNITY)
Admission: RE | Admit: 2014-02-13 | Discharge: 2014-02-13 | Disposition: A | Discharge status: Home / Self Care | Payer: PRIVATE HEALTH INSURANCE | Source: Ambulatory Visit | Attending: Hematology | Admitting: Hematology

## 2014-02-13 DIAGNOSIS — D72829 Elevated white blood cell count, unspecified: Secondary | ICD-10-CM | POA: Insufficient documentation

## 2014-05-01 ENCOUNTER — Encounter: Payer: Self-pay | Admitting: Hematology

## 2014-05-01 ENCOUNTER — Ambulatory Visit (HOSPITAL_BASED_OUTPATIENT_CLINIC_OR_DEPARTMENT_OTHER): Payer: PRIVATE HEALTH INSURANCE | Admitting: Hematology

## 2014-05-01 ENCOUNTER — Telehealth: Payer: Self-pay | Admitting: Hematology

## 2014-05-01 ENCOUNTER — Other Ambulatory Visit (HOSPITAL_BASED_OUTPATIENT_CLINIC_OR_DEPARTMENT_OTHER): Payer: PRIVATE HEALTH INSURANCE

## 2014-05-01 VITALS — BP 125/74 | HR 88 | Temp 98.0°F | Resp 18 | Ht 63.0 in | Wt 276.6 lb

## 2014-05-01 DIAGNOSIS — J45909 Unspecified asthma, uncomplicated: Secondary | ICD-10-CM

## 2014-05-01 DIAGNOSIS — J309 Allergic rhinitis, unspecified: Secondary | ICD-10-CM

## 2014-05-01 DIAGNOSIS — D72829 Elevated white blood cell count, unspecified: Secondary | ICD-10-CM

## 2014-05-01 LAB — CBC WITH DIFFERENTIAL/PLATELET
BASO%: 0.4 % (ref 0.0–2.0)
Basophils Absolute: 0 10*3/uL (ref 0.0–0.1)
EOS%: 4 % (ref 0.0–7.0)
Eosinophils Absolute: 0.5 10*3/uL (ref 0.0–0.5)
HEMATOCRIT: 43.5 % (ref 34.8–46.6)
HGB: 13.9 g/dL (ref 11.6–15.9)
LYMPH%: 19.9 % (ref 14.0–49.7)
MCH: 27.3 pg (ref 25.1–34.0)
MCHC: 31.9 g/dL (ref 31.5–36.0)
MCV: 85.4 fL (ref 79.5–101.0)
MONO#: 0.9 10*3/uL (ref 0.1–0.9)
MONO%: 6.6 % (ref 0.0–14.0)
NEUT#: 9 10*3/uL — ABNORMAL HIGH (ref 1.5–6.5)
NEUT%: 69.1 % (ref 38.4–76.8)
Platelets: 376 10*3/uL (ref 145–400)
RBC: 5.09 10*6/uL (ref 3.70–5.45)
RDW: 13.2 % (ref 11.2–14.5)
WBC: 13.1 10*3/uL — ABNORMAL HIGH (ref 3.9–10.3)
lymph#: 2.6 10*3/uL (ref 0.9–3.3)

## 2014-05-01 NOTE — Progress Notes (Signed)
Carol Ruiz  Telephone:(336) [phone] Fax:(336) [place] Note   Patient Care Team: Carol Ada, MD as PCP - General (Family Medicine) 05/01/2014  CHIEF COMPLAINTS:  Follow-up Leukocytosis   HISTORY OF PRESENTING ILLNESS:  Carol Ruiz 39 y.o. female is here because of mild leukocytosis.   She was found to have elevated WBC since 09/2013. She has moderae seasonal allergy, and she was having  Some allergy symptoms during that time. She was seen by her PCP who ordered some lab work. Her CBC showed WBC 14.1K, ANC 9.5K, ALC 3.3K, Mono 0.9K, normal H/H and plt counts. She had some URI symptoms in Sep and Nov and repeated CBC in Sep and Nov 2015 showed WBC 11.0K, with ANC 7.5K, ALC 2.4K, the rest of CBC and diff were normal.   Her menstrual period has been irregular since one year ago, sometime spotting, sometime heavy, she also has hot flush, she was followed by her GYN, not sure if she is perimenopausal. She denies any history of chronic infection, such as hepatitis B or C, no fever, chills, weight loss, night sweats, pruritis, etc.   INTERIM HISTORY: [name] returns for follow-up. She has been doing well since I saw her 3 months ago. She feels her allergies start getting worse in the spring. She has some intermittent nasal congestion and sneezing. No fever or chills, no cough or other symptoms. She otherwise feels well overall.  Patient Active Problem List   Diagnosis Date Noted  . Asthma 02/02/2014  . Diabetes mellitus 02/02/2014  . Allergic rhinitis 02/02/2014  . Depression 02/02/2014  . Migraines 02/02/2014     SURGICAL HISTORY: No past surgical history on file.  SOCIAL HISTORY: History   Social History  . Marital Status: Married    Spouse Name: N/A  . Number of Children: N/A  . Years of Education: N/A   Occupational History  . Not on file.   Social History Main Topics  . Smoking status: Never Smoker   . Smokeless tobacco: Not on  file  . Alcohol Use: No  . Drug Use: No  . Sexual Activity: No   Other Topics Concern  . Not on file   Social History Narrative    FAMILY HISTORY: Family History  Problem Relation Age of Onset  . Cancer Mother 83    lung cancer   . Diabetes Father   . CAD Father   . Cancer Maternal Aunt 40    breast and uterine cancer     ALLERGIES:  is allergic to penicillins and unasyn.  MEDICATIONS:  Current Outpatient Prescriptions  Medication Sig Dispense Refill  . acyclovir (ZOVIRAX) 800 MG tablet   0  . albuterol-ipratropium (COMBIVENT) 18-103 MCG/ACT inhaler Inhale 2 puffs into the lungs every 4 (four) hours as needed for wheezing or shortness of breath.    . cetirizine (ZYRTEC) 10 MG tablet Take 10 mg by mouth daily.    Marland Kitchen oxybutynin (DITROPAN XL) 15 MG 24 hr tablet Take 15 mg by mouth daily. as directed  0  . venlafaxine XR (EFFEXOR-XR) 75 MG 24 hr capsule   0   No current facility-administered medications for this visit.    REVIEW OF SYSTEMS:   Constitutional: Denies fevers, chills or abnormal night sweats Eyes: Denies blurriness of vision, double vision or watery eyes Ears, nose, mouth, throat, and face: Denies mucositis or sore throat Respiratory: Denies cough, dyspnea or wheezes Cardiovascular: Denies palpitation, chest discomfort or lower extremity swelling Gastrointestinal:  Denies nausea, heartburn or change in bowel habits Skin: Denies abnormal skin rashes Lymphatics: Denies new lymphadenopathy or easy bruising Neurological:Denies numbness, tingling or new weaknesses Behavioral/Psych: Mood is stable, no new changes  All other systems were reviewed with the patient and are negative except those in history .  PHYSICAL EXAMINATION: ECOG PERFORMANCE STATUS: 0 - Asymptomatic  Filed Vitals:   05/01/14 0904  BP: 125/74  Pulse: 88  Temp: 98 F (36.7 C)  Resp: 18   Filed Weights   05/01/14 0904  Weight: 276 lb 9.6 oz (125.465 kg)    GENERAL:alert, no distress  and comfortable SKIN: skin color, texture, turgor are normal, no rashes or significant lesions EYES: normal, conjunctiva are pink and non-injected, sclera clear OROPHARYNX:no exudate, no erythema and lips, buccal mucosa, and tongue normal  NECK: supple, thyroid normal size, non-tender, without nodularity LYMPH:  no palpable lymphadenopathy in the cervical, axillary or inguinal LUNGS: clear to auscultation and percussion with normal breathing effort HEART: regular rate & rhythm and no murmurs and no lower extremity edema ABDOMEN:abdomen soft, non-tender and normal bowel sounds Musculoskeletal:no cyanosis of digits and no clubbing  PSYCH: alert & oriented x 3 with fluent speech NEURO: no focal motor/sensory deficits  LABORATORY DATA:  I have reviewed the data as listed CBC Latest Ref Rng 05/01/2014 01/30/2014  WBC 3.9 - 10.3 10e3/uL 13.1(H) 12.2(H)  Hemoglobin 11.6 - 15.9 g/dL 13.9 13.9  Hematocrit 34.8 - 46.6 % 43.5 42.6  Platelets 145 - 400 10e3/uL 376 342    CMP Latest Ref Rng 01/30/2014  Glucose 70 - 140 mg/dl 106  BUN 7.0 - 26.0 mg/dL 10.7  Creatinine 0.6 - 1.1 mg/dL 0.7  Sodium 136 - 145 mEq/L 139  Potassium 3.5 - 5.1 mEq/L 4.2  CO2 22 - 29 mEq/L 26  Calcium 8.4 - 10.4 mg/dL 9.0  Total Protein 6.4 - 8.3 g/dL 7.1  Total Bilirubin 0.20 - 1.20 mg/dL 0.25  Alkaline Phos 40 - 150 U/L 91  AST 5 - 34 U/L 30  ALT 0 - 55 U/L 43     RADIOGRAPHIC STUDIES: I have personally reviewed the radiological images as listed and agreed with the findings in the report. No results found.  ASSESSMENT & PLAN:  39 yo female with history of allergic rhinitis, asthma, DM, depression, who was found to have mildly elevated WBC, mainly neutrophils, when she has some allergic symptoms and URI episodes.   1. Mild leukocytosis, with dominant neutrophil cells, likely associated with her allergy  -this is likely related to her allergy and URI symptoms.  -Her white count is still slightly elevated today  but stable, the WBC differential is normal except elevated neutrophils. Her hemoglobin and platelet counts are normal. -BCR/ABL and JAK2 mutation were negative, this is unlikely myeloproliferative disorder. -I do not think she needs bone marrow biopsy or further workup. -I suggest her to follow-up with her primary care physician, and a repeat her CBC every 6-12 months.  2. Asthma and allergy -I suggest her to see a allergy specialist   Follow-up: I'll see her on as needed base in the future.  All questions were answered. The patient knows to call the clinic with any problems, questions or concerns. I spent 10 minutes counseling the patient face to face. The total time spent in the appointment was 15 minutes and more than 50% was on counseling.     Truitt Merle, MD 05/01/2014 9:06 AM

## 2014-05-01 NOTE — Telephone Encounter (Signed)
Pt was advised to see her PCP and if she needed to f/u to call the office to schedule an apt..... Gave pt AVS.... KJ

## 2014-05-09 ENCOUNTER — Emergency Department (INDEPENDENT_AMBULATORY_CARE_PROVIDER_SITE_OTHER)
Admission: EM | Admit: 2014-05-09 | Discharge: 2014-05-09 | Disposition: A | Discharge status: Home / Self Care | Payer: PRIVATE HEALTH INSURANCE | Source: Home / Self Care | Attending: Family Medicine | Admitting: Family Medicine

## 2014-05-09 ENCOUNTER — Encounter (HOSPITAL_COMMUNITY): Payer: Self-pay | Admitting: Emergency Medicine

## 2014-05-09 DIAGNOSIS — J302 Other seasonal allergic rhinitis: Secondary | ICD-10-CM

## 2014-05-09 DIAGNOSIS — G43109 Migraine with aura, not intractable, without status migrainosus: Secondary | ICD-10-CM | POA: Diagnosis not present

## 2014-05-09 MED ORDER — METHYLPREDNISOLONE ACETATE 40 MG/ML IJ SUSP
80.0000 mg | Freq: Once | INTRAMUSCULAR | Status: AC
  Administered 2014-05-09: 80 mg via INTRAMUSCULAR

## 2014-05-09 MED ORDER — SUMATRIPTAN SUCCINATE 100 MG PO TABS: 100.0000 mg | ORAL_TABLET | ORAL | Status: AC | PRN

## 2014-05-09 MED ORDER — ONDANSETRON 4 MG PO TBDP
8.0000 mg | ORAL_TABLET | Freq: Once | ORAL | Status: AC
  Administered 2014-05-09: 8 mg via ORAL

## 2014-05-09 MED ORDER — ONDANSETRON 4 MG PO TBDP
ORAL_TABLET | ORAL | Status: AC
  Filled 2014-05-09: qty 2

## 2014-05-09 MED ORDER — TRIAMCINOLONE ACETONIDE 40 MG/ML IJ SUSP
INTRAMUSCULAR | Status: AC
  Filled 2014-05-09: qty 1

## 2014-05-09 MED ORDER — TRIAMCINOLONE ACETONIDE 40 MG/ML IJ SUSP
40.0000 mg | Freq: Once | INTRAMUSCULAR | Status: AC
  Administered 2014-05-09: 40 mg via INTRAMUSCULAR

## 2014-05-09 MED ORDER — METHYLPREDNISOLONE ACETATE 80 MG/ML IJ SUSP
INTRAMUSCULAR | Status: AC
Start: 2014-05-09 — End: 2014-05-09
  Filled 2014-05-09: qty 1

## 2014-05-09 NOTE — ED Notes (Signed)
Pt states that she has a migraine with no relief from OTC medications

## 2014-05-09 NOTE — ED Provider Notes (Signed)
CSN: [id number]     Arrival date & time 05/09/14  1354 History   First MD Initiated Contact with Patient 05/09/14 1412     No chief complaint on file.  (Consider location/radiation/quality/duration/timing/severity/associated sxs/prior Treatment) Patient is a 39 y.o. female presenting with migraines.  Migraine This is a new problem. The current episode started 6 to 12 hours ago. The problem has not changed since onset.Associated symptoms include headaches. Associated symptoms comments: Aura and sl nausea sx typical of her migraines. Has been on out of town trip with duaghter-stressed.Marland Kitchen.    No past medical history on file. No past surgical history on file. Family History  Problem Relation Age of Onset  . Cancer Mother 558    lung cancer   . Diabetes Father   . CAD Father   . Cancer Maternal Aunt 40    breast and uterine cancer    History  Substance Use Topics  . Smoking status: Never Smoker   . Smokeless tobacco: Not on file  . Alcohol Use: No   OB History    No data available     Review of Systems  Constitutional: Negative.   HENT: Positive for congestion, rhinorrhea and sneezing.   Eyes: Positive for photophobia.  Neurological: Positive for headaches.    Allergies  Penicillins and Unasyn  Home Medications   Prior to Admission medications   Medication Sig Start Date End Date Taking? Authorizing Provider  acyclovir (ZOVIRAX) 800 MG tablet Take 800 mg by mouth as needed.  04/03/14   Historical Provider, MD  albuterol-ipratropium (COMBIVENT) 18-103 MCG/ACT inhaler Inhale 2 puffs into the lungs every 4 (four) hours as needed for wheezing or shortness of breath.    Historical Provider, MD  cetirizine (ZYRTEC) 10 MG tablet Take 10 mg by mouth daily.    Historical Provider, MD  oxybutynin (DITROPAN XL) 15 MG 24 hr tablet Take 15 mg by mouth daily. as directed 01/15/14   Historical Provider, MD  SUMAtriptan (IMITREX) 100 MG tablet Take 1 tablet (100 mg total) by mouth every 2  (two) hours as needed for migraine. May repeat in 2 hours if headache persists or recurs. Limit 2 tabs in 24hrs. 05/09/14   Linna HoffJames D Lavita Pontius, MD  venlafaxine XR (EFFEXOR-XR) 75 MG 24 hr capsule Take 75 mg by mouth daily with breakfast.  01/15/14   Historical Provider, MD   BP 151/94 mmHg  Pulse 88  Temp(Src) 97.4 F (36.3 C) (Oral)  Resp 19  SpO2 98% Physical Exam  Constitutional: She is oriented to person, place, and time. She appears well-developed and well-nourished. She appears distressed.  HENT:  Head: Normocephalic.  Right Ear: External ear normal.  Left Ear: External ear normal.  Mouth/Throat: Oropharynx is clear and moist.  Eyes: Conjunctivae and EOM are normal. Pupils are equal, round, and reactive to light.  Neck: Normal range of motion. Neck supple.  Musculoskeletal: Normal range of motion.  Lymphadenopathy:    She has no cervical adenopathy.  Neurological: She is alert and oriented to person, place, and time. No cranial nerve deficit. Coordination normal.  Skin: Skin is warm and dry.  Nursing note and vitals reviewed.   ED Course  Procedures (including critical care time) Labs Review Labs Reviewed - No data to display  Imaging Review No results found.   MDM     Linna HoffJames D Reino Lybbert, MD 05/09/14 1430

## 2015-11-13 ENCOUNTER — Encounter: Payer: Self-pay | Admitting: Oncology

## 2015-11-13 ENCOUNTER — Telehealth: Payer: Self-pay | Admitting: Oncology

## 2015-11-13 NOTE — Telephone Encounter (Signed)
Pt returned my call. Appt scheduled w/Shadad for 10/26 at 2pm. Pt aware to arrive 30 minutes early. Demographics verified. Letter mailed to the pt.

## 2015-12-10 ENCOUNTER — Ambulatory Visit (HOSPITAL_BASED_OUTPATIENT_CLINIC_OR_DEPARTMENT_OTHER): Payer: 59 | Admitting: Oncology

## 2015-12-10 VITALS — BP 137/81 | HR 101 | Temp 97.4°F | Resp 18 | Ht 63.0 in | Wt 286.0 lb

## 2015-12-10 DIAGNOSIS — D72829 Elevated white blood cell count, unspecified: Secondary | ICD-10-CM | POA: Diagnosis not present

## 2015-12-10 DIAGNOSIS — D72823 Leukemoid reaction: Secondary | ICD-10-CM

## 2015-12-10 NOTE — Progress Notes (Signed)
Hematology and Oncology Follow Up Visit  ELLYANNA Ruiz [id number] 1975/10/23 40 y.o. 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 Primary hematological disorders are less likely. This was diagnosed in 2015. Her white cell count fluctuated between 14,000 and 11,000.  Current therapy: Observation and surveillance.  Interim History:  This is a pleasant 40 year old woman seen for leukocytosis. She was evaluated by Dr. Mosetta Putt in 2015 for the same issue. Her white cell count have fluctuated between 14,000 and 11,000. Her workup has been unrevealing since that time. She had a chest x-ray, abdominal ultrasound, myeloproliferative disorder workup along others. All her testing indicate likely reactive findings. She was last seen in 2016 and her repeat CBC at that time was 13,000 with a normal hemoglobin and normal platelet count. She had a normal differential at that time. Her most recent CBC done with her primary care provider her white cell count was around 14,000 with normal differential.  Clinically she has no recent complaints. She continues to have issues with asthma, allergy recurrent sinus infections. She denied any fevers, chills or weight loss. She denied any constitutional symptoms or abdominal pain. She does not report any change in her bowel habits or hematochezia or melena. She did have a mammogram but have not had a colonoscopy.  She denied any headaches, blurry vision, syncope or seizures. She does not report any fevers, chills or sweats. She does not report any cough, wheezing or hemoptysis. She is not reporting nausea, vomiting or abdominal pain. She does not report any frequency urgency or hesitancy. She does not report any skeletal complaints. Remaining review of systems unremarkable.  Medications: I have reviewed the patient's current medications.  Current Outpatient Prescriptions   Medication Sig Dispense Refill  . acyclovir (ZOVIRAX) 800 MG tablet Take 800 mg by mouth as needed.   0  . albuterol-ipratropium (COMBIVENT) 18-103 MCG/ACT inhaler Inhale 2 puffs into the lungs every 4 (four) hours as needed for wheezing or shortness of breath.    . cetirizine (ZYRTEC) 10 MG tablet Take 10 mg by mouth daily.    Marland Kitchen losartan (COZAAR) 50 MG tablet Take 1 tablet by mouth daily.    . norethindrone (MICRONOR,CAMILA,ERRIN) 0.35 MG tablet Take 1 tablet by mouth daily.    . SUMAtriptan (IMITREX) 100 MG tablet Take 1 tablet (100 mg total) by mouth every 2 (two) hours as needed for migraine. May repeat in 2 hours if headache persists or recurs. Limit 2 tabs in 24hrs. 10 tablet 0  . venlafaxine XR (EFFEXOR-XR) 75 MG 24 hr capsule Take 75 mg by mouth daily with breakfast.   0  . VESICARE 10 MG tablet Take 10 mg by mouth daily.     No current facility-administered medications for this visit.      Allergies:  Allergies  Allergen Reactions  . Penicillins Shortness Of Breath    All PCN related drugs.  . Unasyn [Ampicillin-Sulbactam Sodium] Hives, Shortness Of Breath and Hypertension    Tachycardia    Past Medical History, Surgical history, Social history, and Family History were reviewed and updated.   Physical Exam: Blood pressure 137/81, pulse (!) 101, temperature 97.4 F (36.3 C), temperature source Oral, resp. rate 18, height 5\' 3"  (1.6 m), weight 286 lb (129.7 kg), SpO2 97 %. ECOG:  0 General appearance: alert and cooperative appeared without distress. Head: Normocephalic, without obvious abnormality Neck: no adenopathy Lymph nodes: Cervical, supraclavicular, and axillary nodes normal.  Heart:regular rate and rhythm, S1, S2 normal, no murmur, click, rub or gallop Lung:chest clear, no wheezing, rales, normal symmetric air entry.  Abdomin: soft, non-tender, without masses or organomegaly no shifting dullness or ascites. EXT:no erythema, induration, or nodules   Lab  Results: Lab Results  Component Value Date   WBC 13.1 (H) 05/01/2014   HGB 13.9 05/01/2014   HCT 43.5 05/01/2014   MCV 85.4 05/01/2014   PLT 376 05/01/2014     Chemistry      Component Value Date/Time   NA 139 01/30/2014 1239   K 4.2 01/30/2014 1239   CO2 26 01/30/2014 1239   BUN 10.7 01/30/2014 1239   CREATININE 0.7 01/30/2014 1239      Component Value Date/Time   CALCIUM 9.0 01/30/2014 1239   ALKPHOS 91 01/30/2014 1239   AST 30 01/30/2014 1239   ALT 43 01/30/2014 1239   BILITOT 0.25 01/30/2014 1239       Impression and Plan:   40 year old woman with the following issues:  1. Leukocytosis related to reactive causes such as asthma and allergy. I see no evidence to suggest primary hematological disorder. Her white cell count have been mild and fluctuating between 11 and 14,000 in the last 2 years.   Her workup for chronic myelogenous leukemia and myeloproliferative disorder performed previously in December 2015 were personally reviewed and showed no evidence to suggest these conditions.  Reactive leukocytosis related to malignancy is also considered to be less likely. She had chest x-ray, abdominal ultrasound and mammography without any evidence to suggest malignancy.  I anticipate that she will continue to have fluctuating leukocytosis given her asthma and allergy and her new baseline will be around 10-14,000. I see no need for further hematological workup.   If her white cell count started rising exponentially then reevaluation will be needed. At this time I have recommended no further hematology workup or follow-up but we are happy to see her in the future as needed.  2. I encouraged her to continue her age-appropriate cancer screening and she is currently up-to-date.  Eli HoseSHADAD,Cozy Veale, MD 10/26/20172:02 PM

## 2016-03-15 IMAGING — CR DG CHEST 2V
2 series · 2 of 2 positions shown · non-contrast
Comparison: 12/17/2002

CLINICAL DATA: Acute bronchitis.  Chest pain.

EXAM:
CHEST  2 VIEW

[view not recorded (1 of 2)]
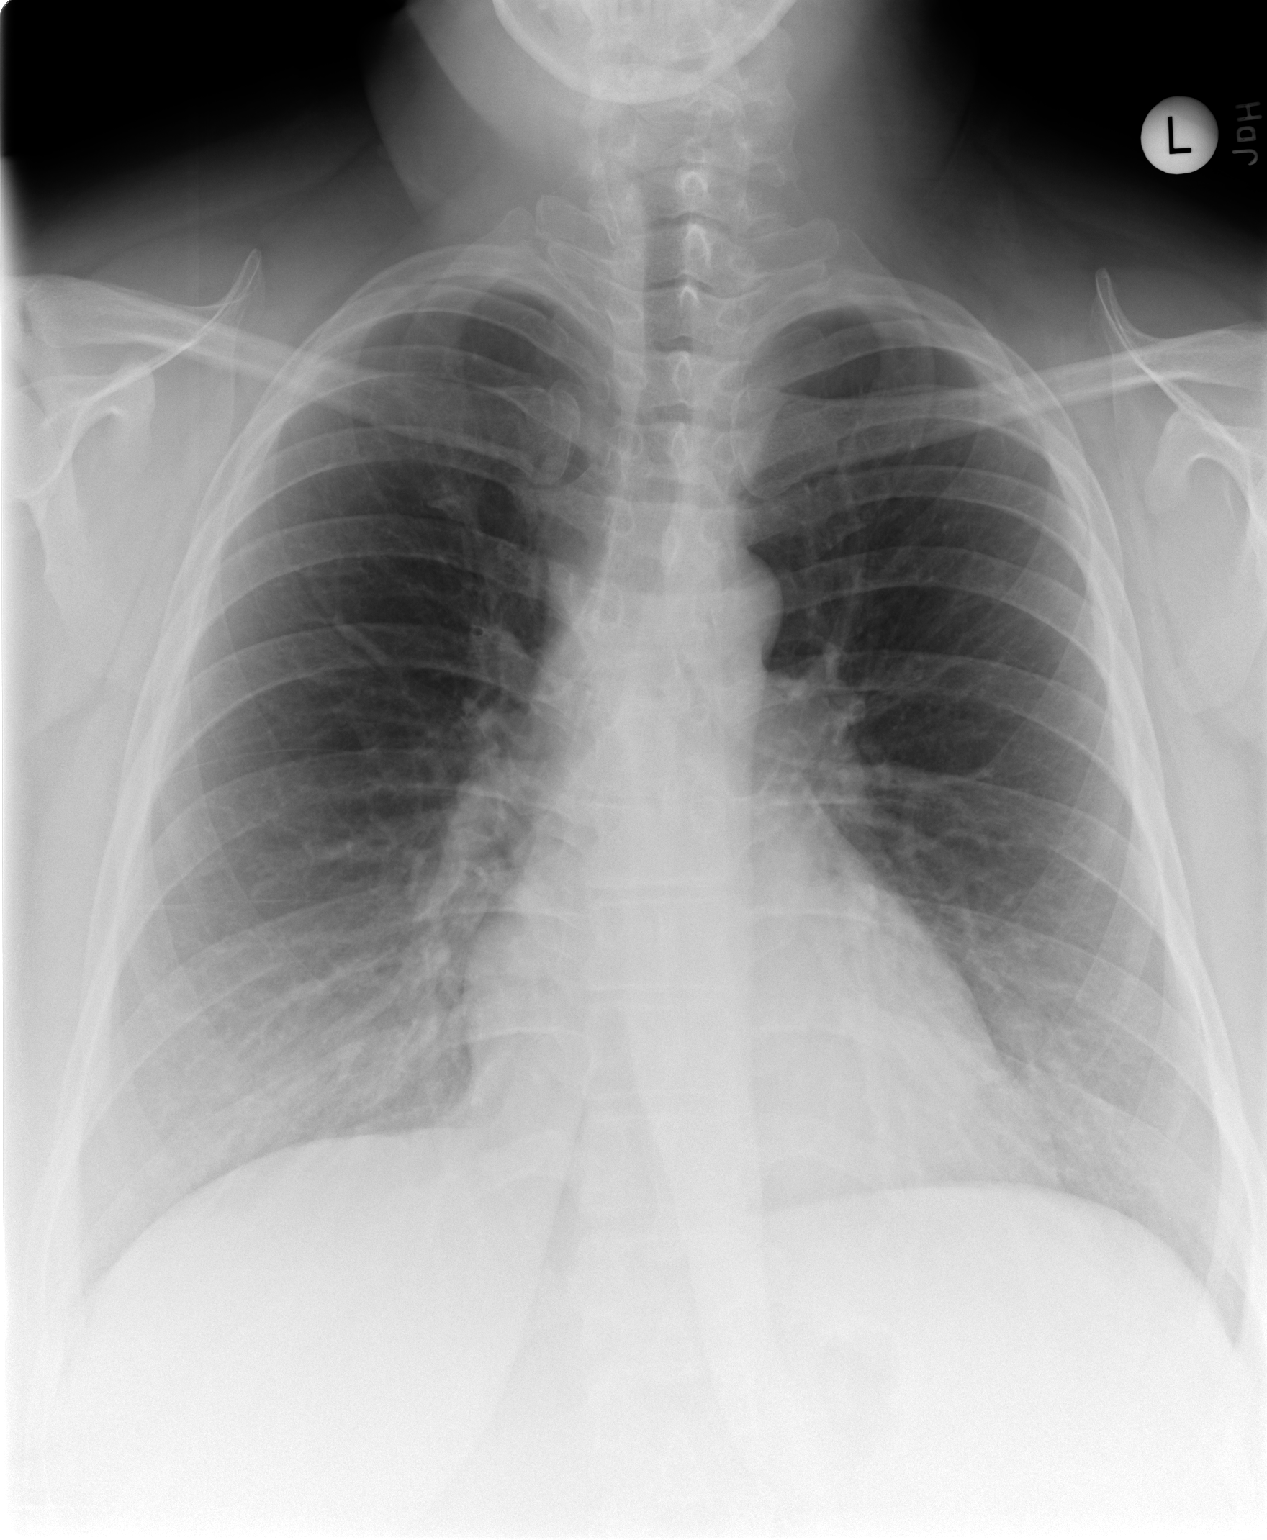

[view not recorded (2 of 2)]
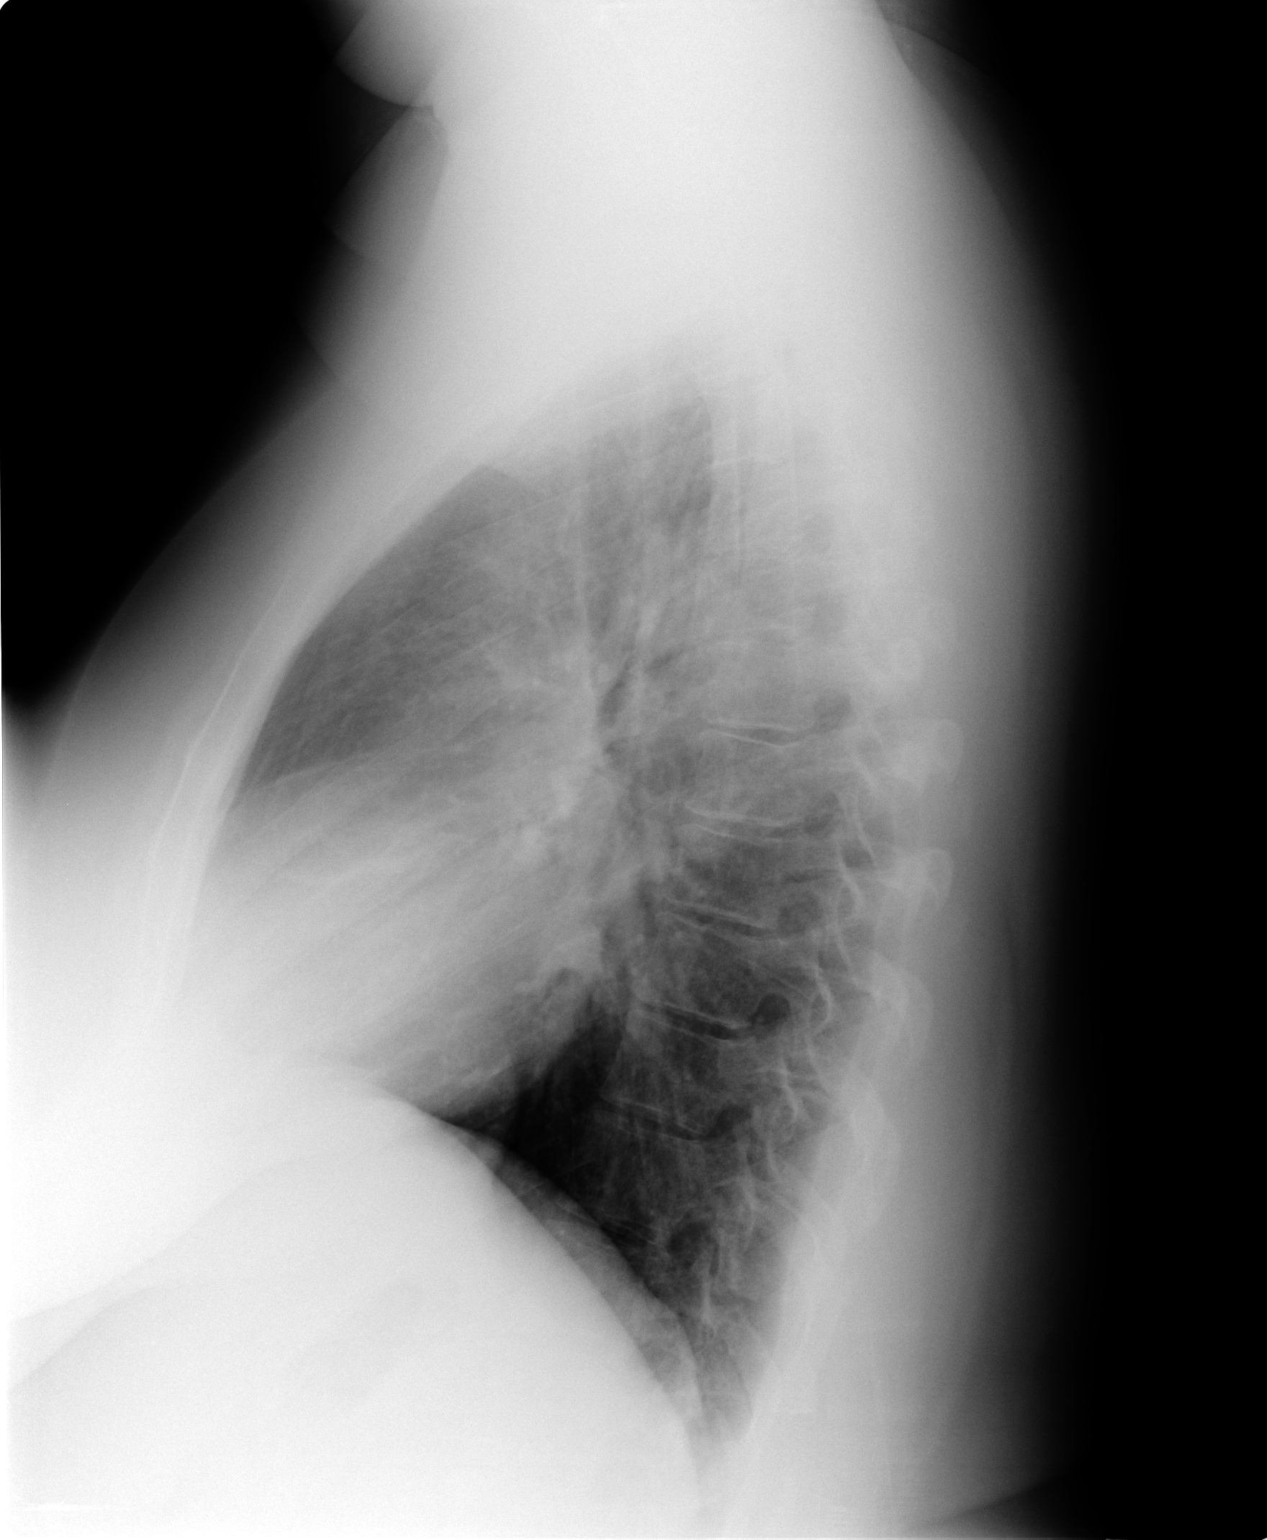

[2 of 2 positions shown; findings below may reference images not displayed]

FINDINGS: Normal mediastinum and cardiac silhouette. Normal pulmonary
vasculature. No evidence of effusion, infiltrate, or pneumothorax.
No acute bony abnormality.
IMPRESSION: Normal chest radiograph.

## 2016-03-28 IMAGING — US US ABDOMEN COMPLETE
1 series · 13 of 25 positions shown · non-contrast
Comparison: None.

CLINICAL DATA: Elevated white blood cell count; assess liver and
spleen

EXAM:
ULTRASOUND ABDOMEN COMPLETE

[Series 1: us abdomen complete · 0.28mm/px · 13 of 56 slices shown]
[im 1/56]
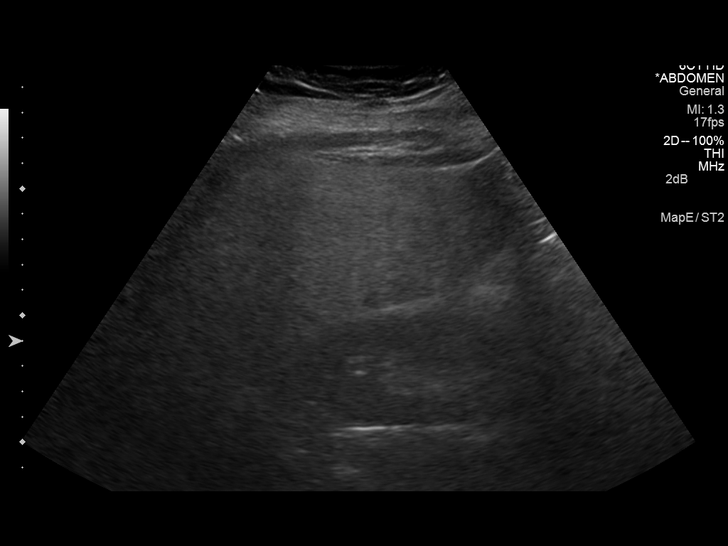
[im 5/56]
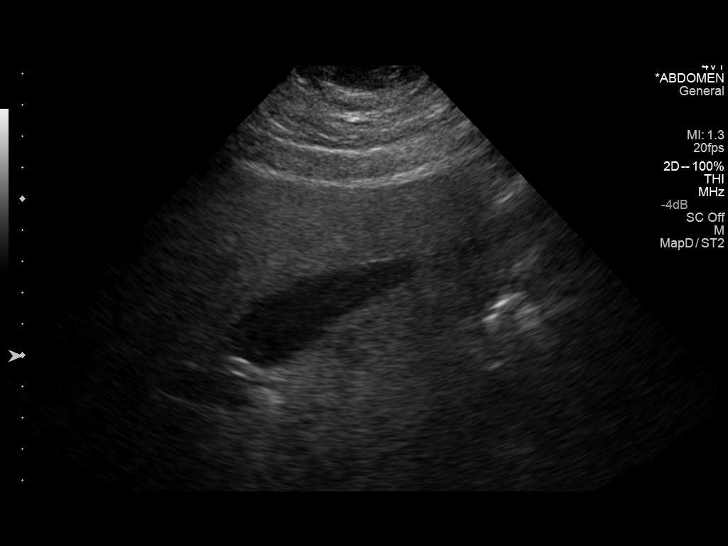
[im 10/56]
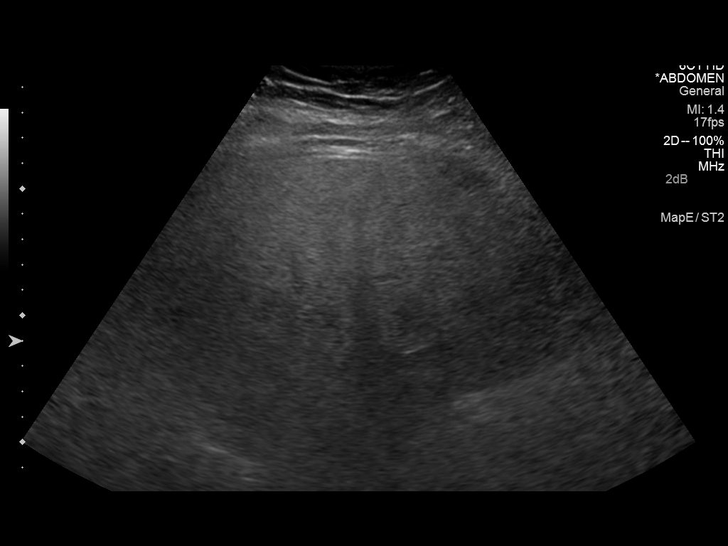
[im 14/56]
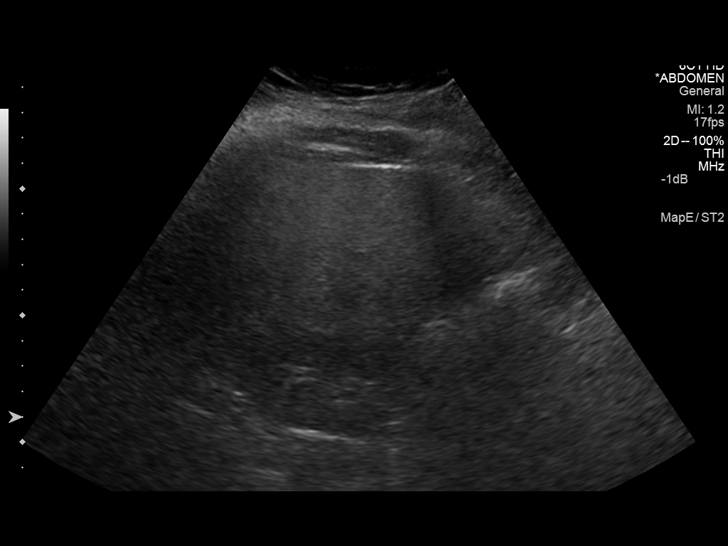
[im 19/56]
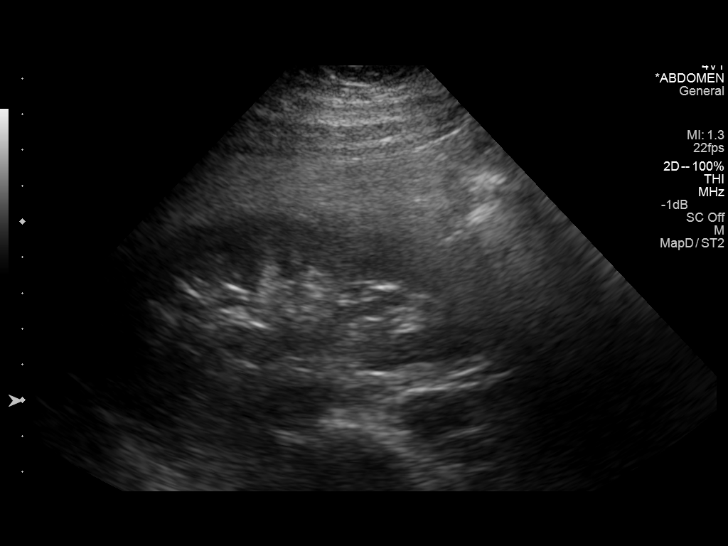
[im 23/56]
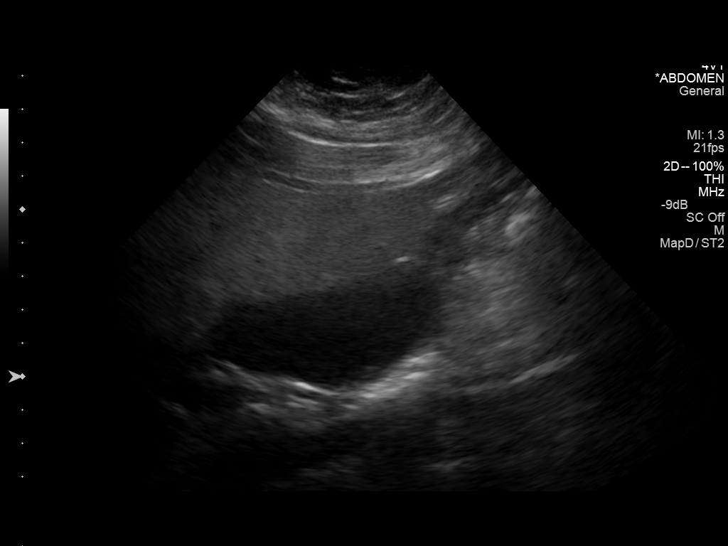
[im 28/56]
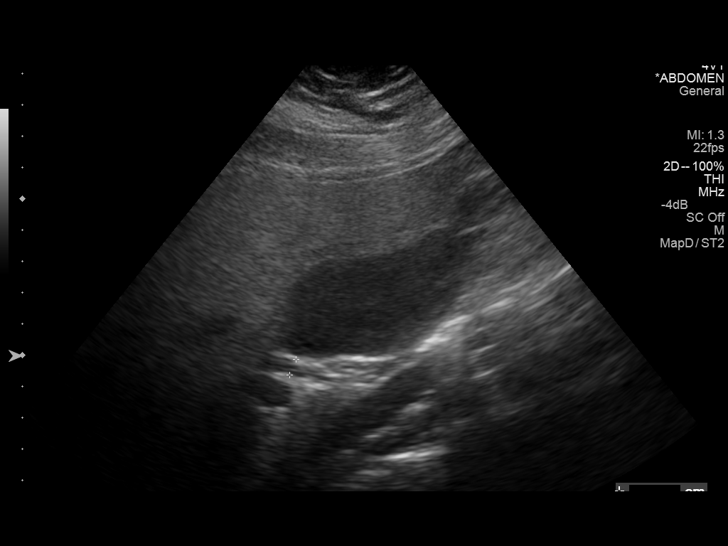
[im 33/56]
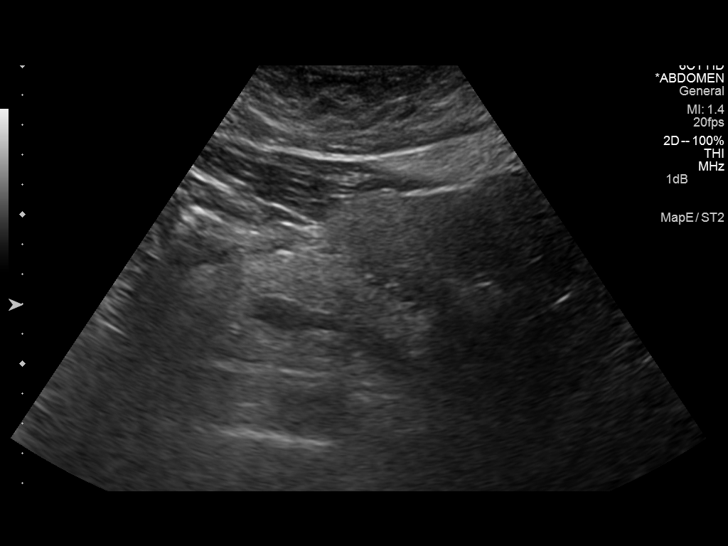
[im 37/56]
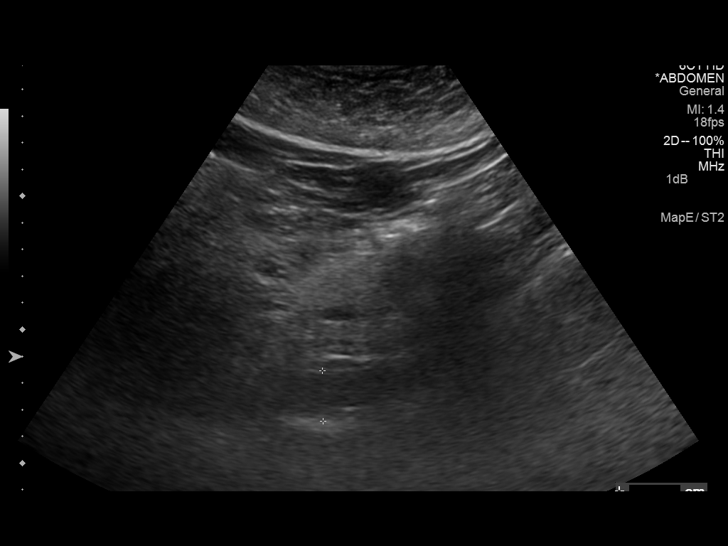
[im 42/56]
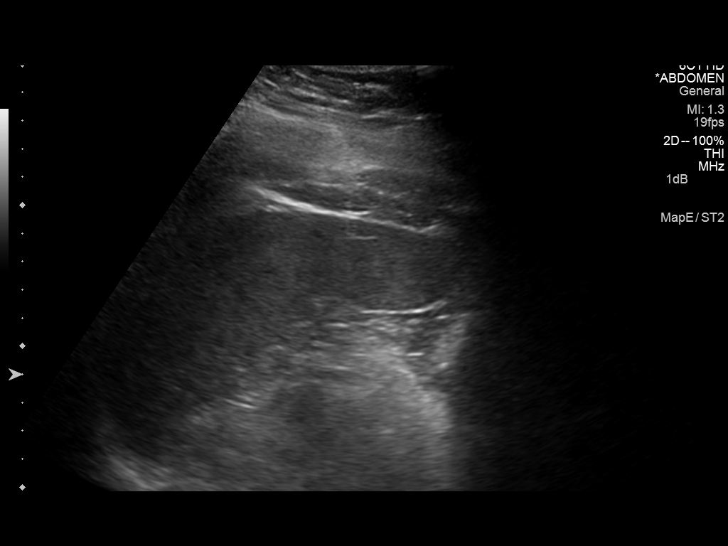
[im 46/56]
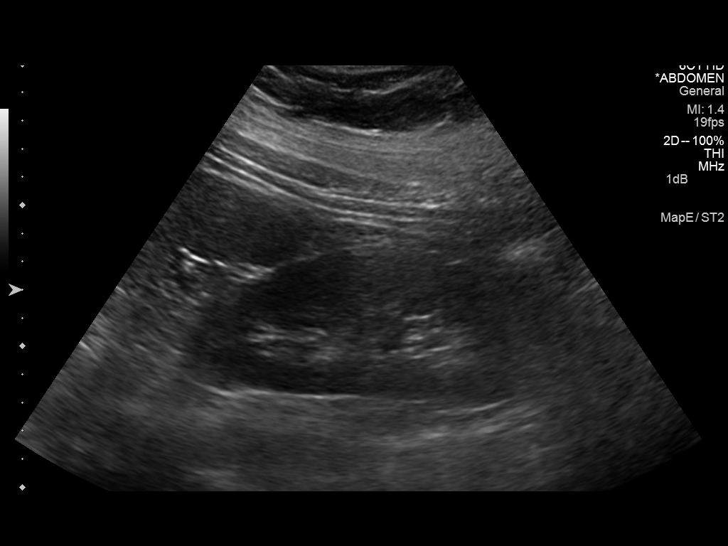
[im 51/56]
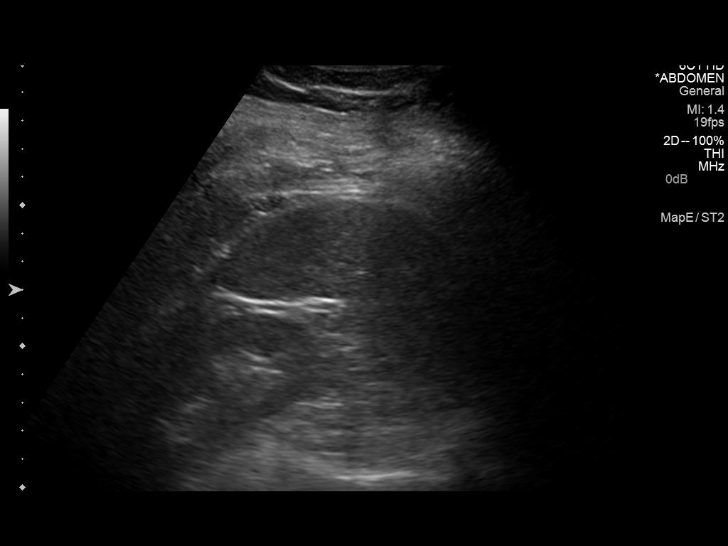
[im 56/56]
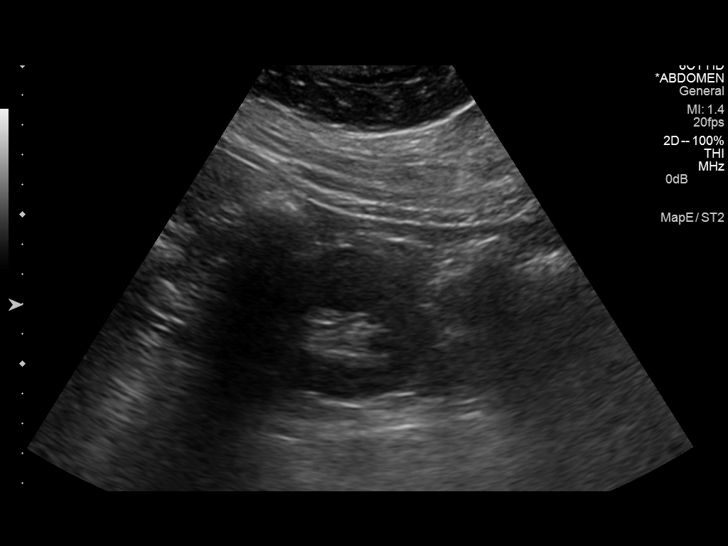

[13 of 25 positions shown; findings below may reference images not displayed]

FINDINGS: Gallbladder: No gallstones or wall thickening visualized. No
sonographic Murphy sign noted.

Common bile duct: Diameter: 5 mm

Liver: The hepatic echotexture is mildly increased. Normal
echotexture is noted adjacent to the gallbladder. There is no
intrahepatic ductal dilation.

IVC: Obscured by bowel gas.

Pancreas: The pancreatic head and body are unremarkable where
visualized. The tail is obscured by bowel gas.

Spleen: Size and appearance within normal limits. The maximal
measured dimension is 11 cm

Right Kidney: Length: 11.6 cm. Echogenicity within normal limits. No
mass or hydronephrosis visualized.

Left Kidney: Length: 12.9 cm.. Echogenicity within normal limits. No
mass or hydronephrosis visualized.

Abdominal aorta: Bowel gas limited evaluation of much of the aorta.
The visualized portions are unremarkable.

Other findings: No ascites is demonstrated.
IMPRESSION: 1. The study is limited due to bowel gas and the patient's body
habitus. There are likely fatty infiltrative changes of the liver.
There is no focal hepatic mass. The the gallbladder and the
visualized portions of the pancreas are normal.
2. The spleen is normal in size and echotexture.

## 2018-03-14 ENCOUNTER — Other Ambulatory Visit: Payer: Self-pay | Admitting: Family Medicine

## 2018-03-14 DIAGNOSIS — R7989 Other specified abnormal findings of blood chemistry: Secondary | ICD-10-CM

## 2018-03-14 DIAGNOSIS — R945 Abnormal results of liver function studies: Principal | ICD-10-CM

## 2018-03-19 ENCOUNTER — Ambulatory Visit
Admission: RE | Admit: 2018-03-19 | Discharge: 2018-03-19 | Disposition: A | Discharge status: Home / Self Care | Payer: Managed Care, Other (non HMO) | Source: Ambulatory Visit | Attending: Family Medicine | Admitting: Family Medicine

## 2018-03-19 DIAGNOSIS — R945 Abnormal results of liver function studies: Principal | ICD-10-CM

## 2018-03-19 DIAGNOSIS — R7989 Other specified abnormal findings of blood chemistry: Secondary | ICD-10-CM

## 2020-06-19 IMAGING — US US ABDOMEN LIMITED
1 series · 14 of 25 positions shown · non-contrast
Comparison: February 13, 2014

CLINICAL DATA: Elevated LFTs.

EXAM:
ULTRASOUND ABDOMEN LIMITED RIGHT UPPER QUADRANT

[Series 1: us abdomen limited · 0.21mm/px · 14 of 45 slices shown]
[im 1/45]
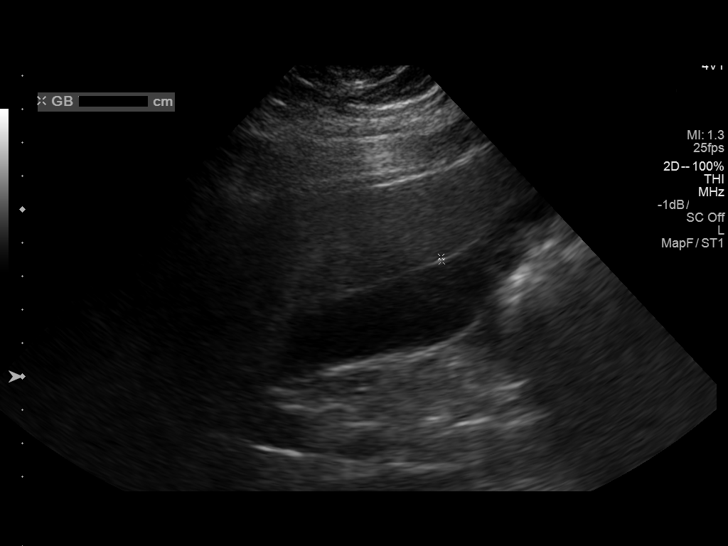
[im 4/45]
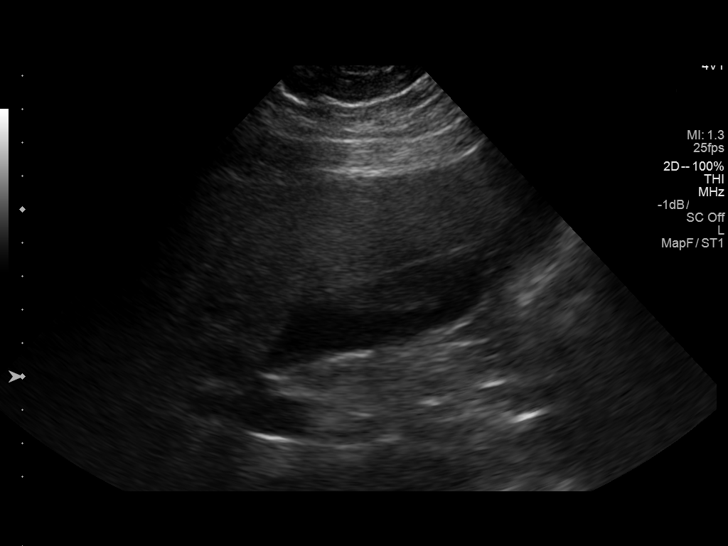
[im 8/45]
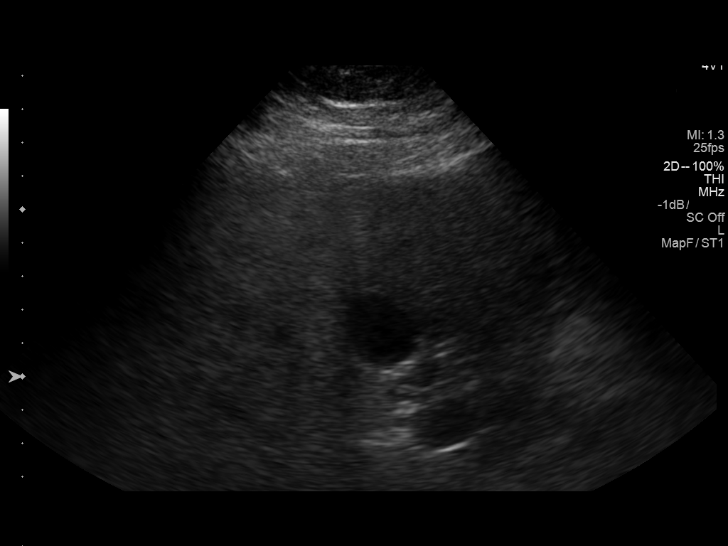
[im 12/45]
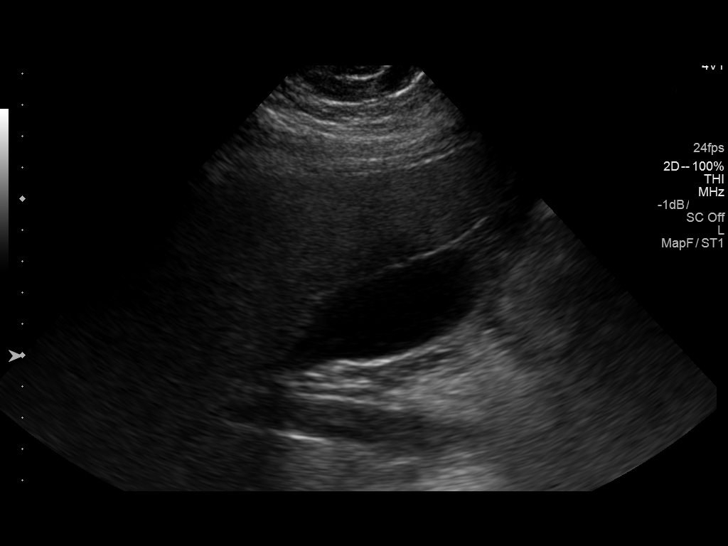
[im 15/45]
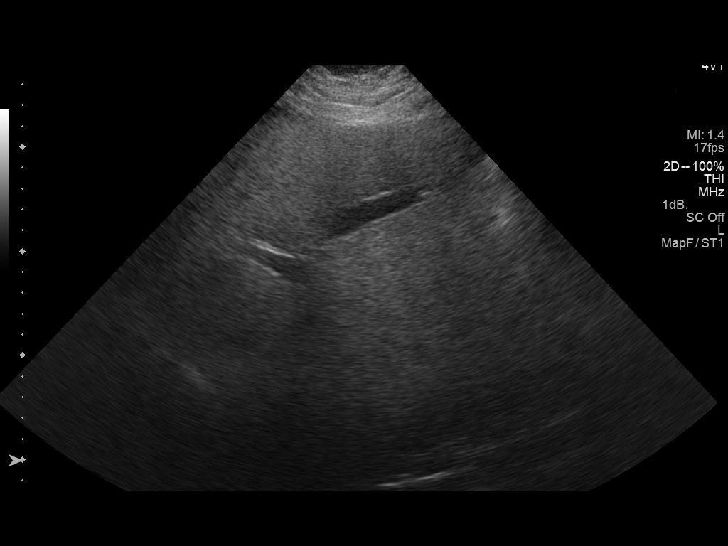
[im 17/45]
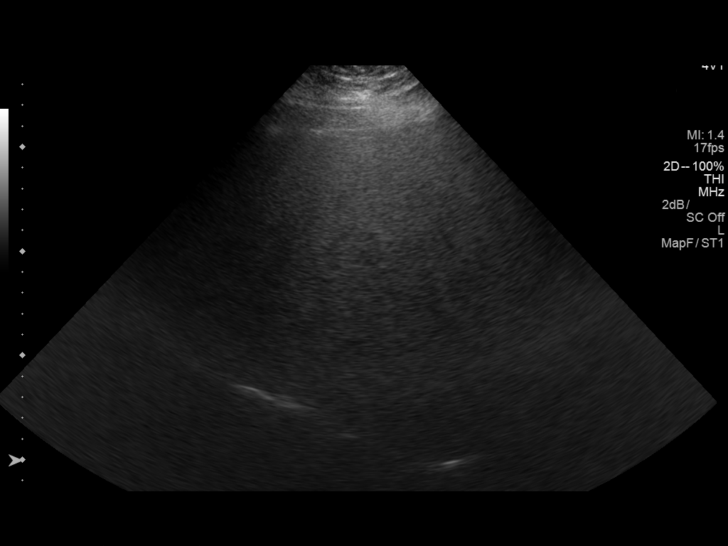
[im 21/45]
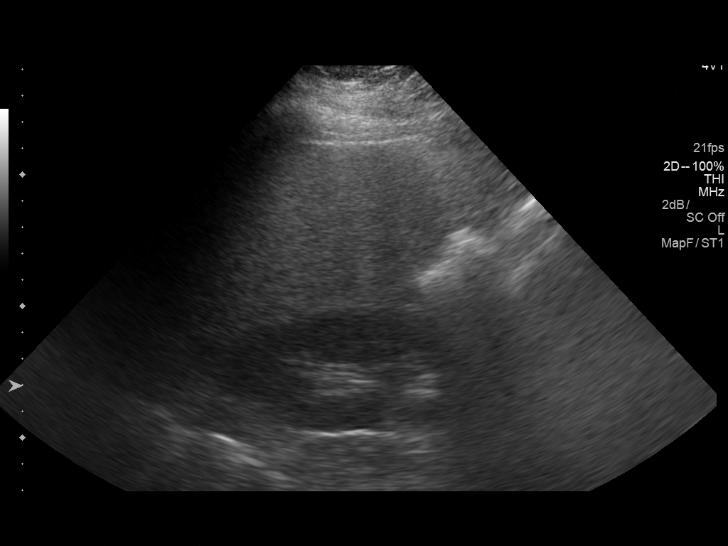
[im 24/45]
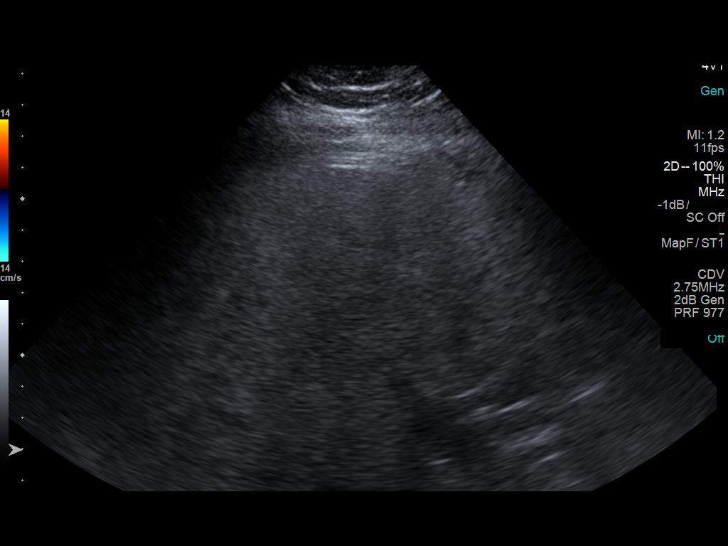
[im 28/45]
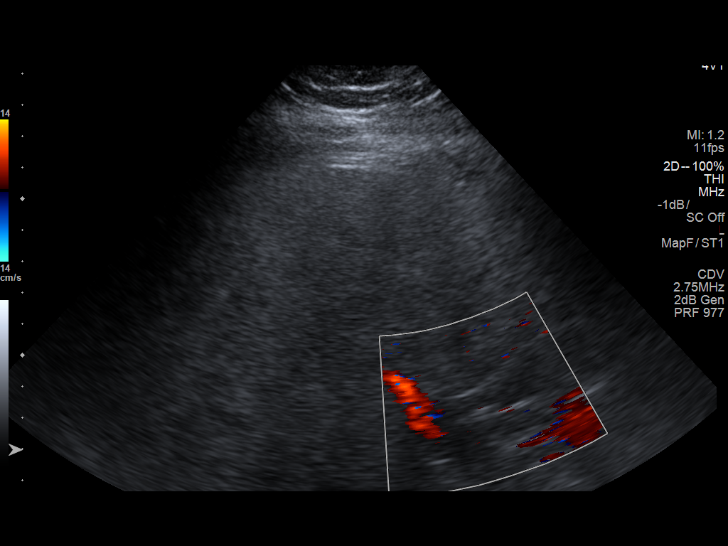
[im 30/45]
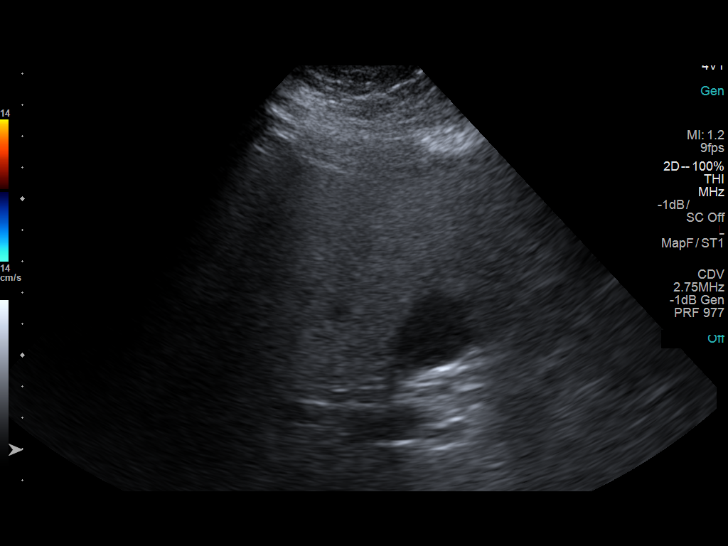
[im 34/45]
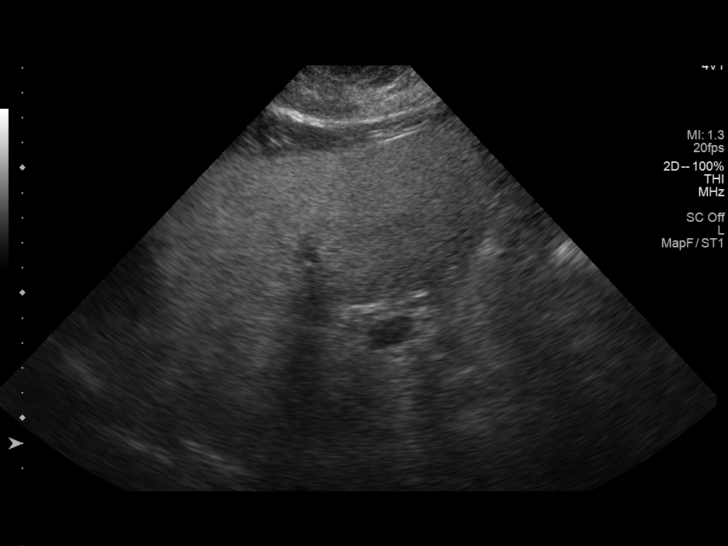
[im 37/45]
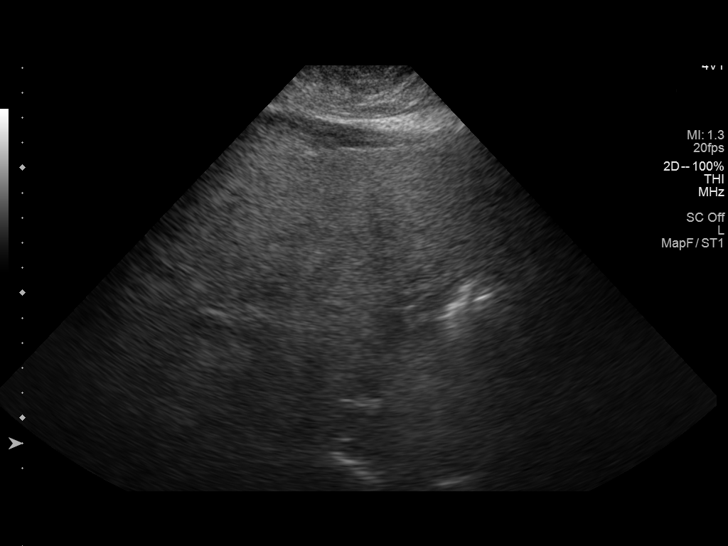
[im 41/45]
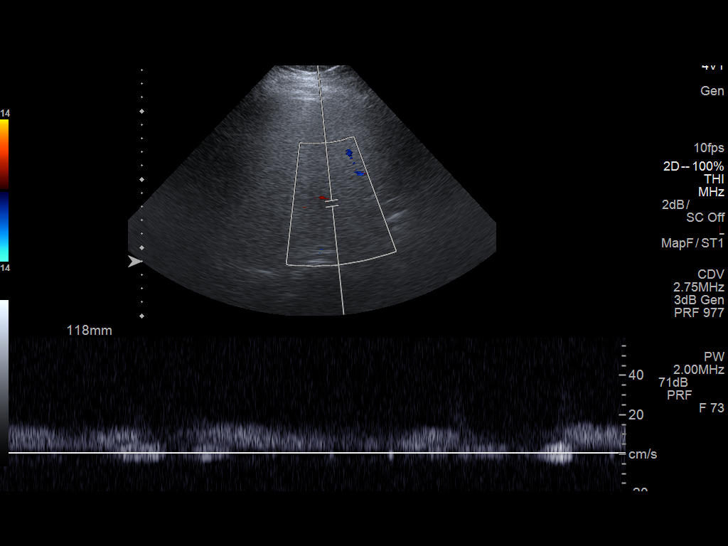
[im 45/45]
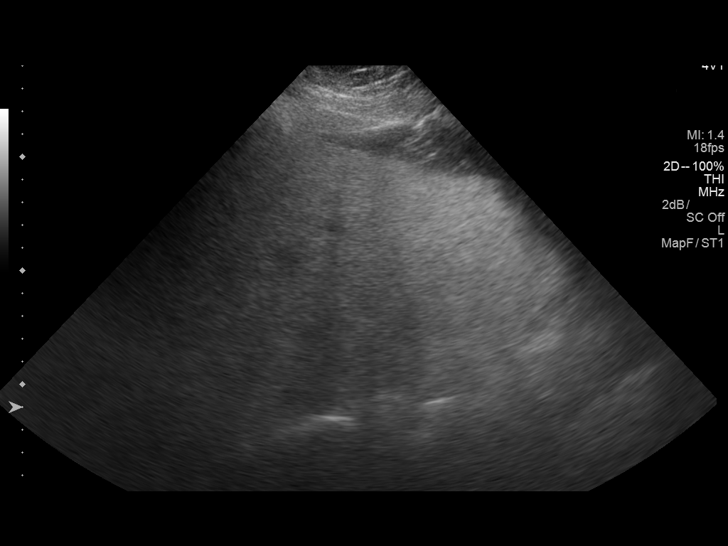

[14 of 25 positions shown; findings below may reference images not displayed]

FINDINGS: Gallbladder:

No gallstones or wall thickening visualized. No sonographic Murphy
sign noted by sonographer.

Common bile duct:

Diameter: 5.4 mm

Liver:

Limited evaluation due to patient body habitus. Diffuse increased
hepatic echogenicity with no focal mass. Evaluation of the portal
vein is limited as well. However, there does appear to be normal
directional flow in the portal vein based on spectral tracing
images.
IMPRESSION: 1. Limited evaluation of the liver due to patient body habitus.
Diffuse increased echogenicity, likely hepatic steatosis but
nonspecific.
2. Evaluation of the portal vein is limited as well. However, there
does appear to be normal directional blood flow in the portal vein
based on spectral tracing images.
